# Patient Record
Sex: Male | Born: 1990 | Race: White | Hispanic: No | Smoking: Current every day smoker
Health system: Southern US, Community
[De-identification: ages and names within clinical notes are randomized; demographics above are authoritative.]

## PROBLEM LIST (undated history)

## (undated) DIAGNOSIS — L0591 Pilonidal cyst without abscess: Secondary | ICD-10-CM

## (undated) DIAGNOSIS — E119 Type 2 diabetes mellitus without complications: Secondary | ICD-10-CM

## (undated) DIAGNOSIS — S62609A Fracture of unspecified phalanx of unspecified finger, initial encounter for closed fracture: Secondary | ICD-10-CM

## (undated) DIAGNOSIS — Z9889 Other specified postprocedural states: Secondary | ICD-10-CM

## (undated) DIAGNOSIS — R112 Nausea with vomiting, unspecified: Secondary | ICD-10-CM

## (undated) HISTORY — DX: Type 2 diabetes mellitus without complications: E11.9

---
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
## 2011-01-22 ENCOUNTER — Ambulatory Visit (INDEPENDENT_AMBULATORY_CARE_PROVIDER_SITE_OTHER): Payer: BC Managed Care – PPO | Admitting: "Endocrinology

## 2011-01-22 DIAGNOSIS — IMO0001 Reserved for inherently not codable concepts without codable children: Secondary | ICD-10-CM

## 2011-01-22 DIAGNOSIS — N62 Hypertrophy of breast: Secondary | ICD-10-CM

## 2011-01-22 DIAGNOSIS — I10 Essential (primary) hypertension: Secondary | ICD-10-CM

## 2011-01-22 DIAGNOSIS — E782 Mixed hyperlipidemia: Secondary | ICD-10-CM

## 2011-01-22 DIAGNOSIS — E669 Obesity, unspecified: Secondary | ICD-10-CM

## 2011-02-21 LAB — DIFFERENTIAL
Basophils Absolute: 0 10*3/uL (ref 0.0–0.1)
Basophils Relative: 0 % (ref 0–1)
Lymphocytes Relative: 31 % (ref 24–48)
Monocytes Relative: 8 % (ref 3–11)
Neutrophils Relative %: 59 % (ref 43–71)

## 2011-02-21 LAB — CBC
HCT: 43.2 % (ref 36.0–49.0)
Hemoglobin: 14.9 g/dL (ref 12.0–16.0)
MCHC: 34.6 g/dL (ref 31.0–37.0)
Platelets: 327 10*3/uL (ref 150–400)
RDW: 13.2 % (ref 11.4–15.5)
WBC: 8.4 10*3/uL (ref 4.5–13.5)

## 2011-02-21 LAB — BASIC METABOLIC PANEL
BUN: 10 mg/dL (ref 6–23)
Calcium: 9.8 mg/dL (ref 8.4–10.5)
Glucose, Bld: 154 mg/dL — ABNORMAL HIGH (ref 70–99)
Potassium: 3.8 mEq/L (ref 3.5–5.1)
Sodium: 138 mEq/L (ref 135–145)

## 2011-02-21 LAB — GLUCOSE, CAPILLARY
Glucose-Capillary: 177 mg/dL — ABNORMAL HIGH (ref 70–99)
Glucose-Capillary: 179 mg/dL — ABNORMAL HIGH (ref 70–99)
Glucose-Capillary: 188 mg/dL — ABNORMAL HIGH (ref 70–99)
Glucose-Capillary: 194 mg/dL — ABNORMAL HIGH (ref 70–99)
Glucose-Capillary: 232 mg/dL — ABNORMAL HIGH (ref 70–99)

## 2011-02-21 LAB — URINALYSIS, ROUTINE W REFLEX MICROSCOPIC
Glucose, UA: 100 mg/dL — AB
Hgb urine dipstick: NEGATIVE
Ketones, ur: 15 mg/dL — AB
Specific Gravity, Urine: 1.03 (ref 1.005–1.030)

## 2011-02-21 LAB — HEMOGLOBIN A1C
Hgb A1c MFr Bld: 11.6 % — ABNORMAL HIGH (ref 4.6–6.1)
Mean Plasma Glucose: 286 mg/dL

## 2011-02-21 LAB — POCT I-STAT EG7
Bicarbonate: 29.2 mEq/L — ABNORMAL HIGH (ref 20.0–24.0)
O2 Saturation: 98 %
Potassium: 4.5 mEq/L (ref 3.5–5.1)
TCO2: 31 mmol/L (ref 0–100)
pCO2, Ven: 46.8 mmHg (ref 45.0–50.0)

## 2011-02-21 LAB — GLUTAMIC ACID DECARBOXYLASE AUTO ABS: Glutamic Acid Decarb Ab: <1 U/mL (ref <=1.0)

## 2011-02-21 LAB — INSULIN ANTIBODIES, BLOOD: Insulin Antibodies, Human: 0.1 U/mL (ref <0.4)

## 2011-02-21 LAB — C-PEPTIDE: C-Peptide: 3.37 ng/mL (ref 0.80–3.90)

## 2011-02-21 LAB — INSULIN, RANDOM: Insulin: 33 u[IU]/mL — ABNORMAL HIGH (ref 3–28)

## 2011-02-21 LAB — MAGNESIUM: Magnesium: 1.9 mg/dL (ref 1.5–2.5)

## 2011-02-21 LAB — ANTI-ISLET CELL ANTIBODY: Pancreatic Islet Cell Antibody: <5 JDF Units (ref <5)

## 2011-03-14 ENCOUNTER — Other Ambulatory Visit: Payer: Self-pay | Admitting: *Deleted

## 2011-03-14 ENCOUNTER — Encounter: Payer: Self-pay | Admitting: *Deleted

## 2011-03-14 DIAGNOSIS — E049 Nontoxic goiter, unspecified: Secondary | ICD-10-CM

## 2011-03-14 DIAGNOSIS — IMO0001 Reserved for inherently not codable concepts without codable children: Secondary | ICD-10-CM

## 2011-03-14 DIAGNOSIS — E669 Obesity, unspecified: Secondary | ICD-10-CM

## 2011-03-14 DIAGNOSIS — E782 Mixed hyperlipidemia: Secondary | ICD-10-CM

## 2011-04-25 ENCOUNTER — Ambulatory Visit (INDEPENDENT_AMBULATORY_CARE_PROVIDER_SITE_OTHER): Payer: BC Managed Care – PPO | Admitting: "Endocrinology

## 2011-04-25 VITALS — BP 132/83 | HR 82 | Wt 241.0 lb

## 2011-04-25 DIAGNOSIS — F17200 Nicotine dependence, unspecified, uncomplicated: Secondary | ICD-10-CM

## 2011-04-25 DIAGNOSIS — N62 Hypertrophy of breast: Secondary | ICD-10-CM

## 2011-04-25 DIAGNOSIS — I10 Essential (primary) hypertension: Secondary | ICD-10-CM

## 2011-04-25 DIAGNOSIS — IMO0001 Reserved for inherently not codable concepts without codable children: Secondary | ICD-10-CM

## 2011-04-25 DIAGNOSIS — E049 Nontoxic goiter, unspecified: Secondary | ICD-10-CM

## 2011-04-25 MED ORDER — LISINOPRIL 10 MG PO TABS: 10.0000 mg | ORAL_TABLET | Freq: Every day | ORAL | Status: DC

## 2011-08-09 ENCOUNTER — Ambulatory Visit: Payer: BC Managed Care – PPO | Admitting: "Endocrinology

## 2011-08-13 ENCOUNTER — Ambulatory Visit: Payer: BC Managed Care – PPO | Admitting: "Endocrinology

## 2011-08-14 ENCOUNTER — Ambulatory Visit (INDEPENDENT_AMBULATORY_CARE_PROVIDER_SITE_OTHER): Payer: BC Managed Care – PPO | Admitting: "Endocrinology

## 2011-08-14 ENCOUNTER — Encounter: Payer: Self-pay | Admitting: "Endocrinology

## 2011-08-14 VITALS — BP 121/72 | HR 91 | Wt 234.5 lb

## 2011-08-14 DIAGNOSIS — R945 Abnormal results of liver function studies: Secondary | ICD-10-CM

## 2011-08-14 DIAGNOSIS — N62 Hypertrophy of breast: Secondary | ICD-10-CM | POA: Insufficient documentation

## 2011-08-14 DIAGNOSIS — IMO0001 Reserved for inherently not codable concepts without codable children: Secondary | ICD-10-CM

## 2011-08-14 DIAGNOSIS — E669 Obesity, unspecified: Secondary | ICD-10-CM

## 2011-08-14 DIAGNOSIS — I10 Essential (primary) hypertension: Secondary | ICD-10-CM | POA: Insufficient documentation

## 2011-08-14 DIAGNOSIS — E119 Type 2 diabetes mellitus without complications: Secondary | ICD-10-CM

## 2011-08-14 DIAGNOSIS — L83 Acanthosis nigricans: Secondary | ICD-10-CM

## 2011-08-14 DIAGNOSIS — R7989 Other specified abnormal findings of blood chemistry: Secondary | ICD-10-CM

## 2011-08-14 DIAGNOSIS — E782 Mixed hyperlipidemia: Secondary | ICD-10-CM | POA: Insufficient documentation

## 2011-08-14 DIAGNOSIS — E049 Nontoxic goiter, unspecified: Secondary | ICD-10-CM

## 2011-08-14 LAB — POCT GLYCOSYLATED HEMOGLOBIN (HGB A1C): Hemoglobin A1C: 5.6

## 2011-08-14 NOTE — Progress Notes (Signed)
Subjective:  Patient Name: Grant Mcdonald Date of Birth: 04-Jan-1991  MRN: [id number]  Grant Mcdonald  presents to the office today for follow-up of his type 2 diabetes mellitus, goiter, acanthosis, obesity, gynecomastia, and hypertension.  HISTORY OF PRESENT ILLNESS:   Grant Mcdonald is a 20 y.o. Caucasian male.  1. The patient was admitted to [facility] pediatric ward on 10/14/2009 by his primary care pediatrician Dr. Eliberto Ivory of [facility], for new onset diabetes mellitus. The patient was then 49-1/19 years old. For several weeks prior to admission he had progressive polyuria and polydipsia. His grandmother, who has type 2 diabetes herself, tested his blood sugar. He had several blood sugars in the 200s and even one more than 300. She started him on her own medicine, glimepiride, approximately half dose for several days. She then made arrangements for the patient be seen by Dr. Chestine Spore. In Dr. Ophelia Charter office his blood sugars were in the 170s. Dr. Chestine Spore then made arrangements for the child to be admitted. On admission he was noted to be mildly dehydrated. He was also noted to have acanthosis of the posterior neck. He was obese, with a weight of 112.5 kg (250 pounds) and height of 187.36 cm (74 inches). His initial venous pH was 7.4. Hemoglobin A1c was 11.6%. Urine glucose was 1000 and he had small ketones. A serum beta hydroxybutyric was 7.7 with normal being less than 3.0. His insulin C-peptide was 3.37, with normals being 0.9-3.9. His thyroid function tests were normal. I saw the patient in consultation on the pediatric ward. Family history was positive for the the patient's maternal grandmother and father having had diabetes. Father died of an MI at age 66. In addition to having obesity and diabetes, the father was also a smoker. The mother had bipolar disease. She died shortly before the admission. The child was living with paternal grandmother, Ms. Weniger, who had been taking  care of him since 56 days of age. On examination hia blood pressure was 129/71. He had a 25+ gram goiter. He had 2+ acanthosis nigricans of his neck. His breasts were enlarged with a Tanner 2 configuration. The right areola was 45 mm in diameter. The left was 47 mm in diameter. He was started on metformin 500 mg twice daily.  2. During the last 2 years the patient's weight, gynecomastia, and diabetes have improved gradually, but not always progressively. In November of 2011 his weight was down to 234. On his last clinic visit on 01/22/2011 his weight was down to 232 pounds. Blood pressure at that point was 136/79. I encouraged him to exercise at least 1 hour per day. 3. Pertinent Review of Systems:  Constitutional: The patient feels well, is healthy, and has no significant complaints. Eyes: Vision is good. There are no significant eye complaints. Neck: The patient has no complaints of anterior neck swelling, soreness, tenderness,  pressure, discomfort, or difficulty swallowing.  Heart: Heart rate increases with exercise or other physical activity. The patient has no complaints of palpitations, irregular heat beats, chest pain, or chest pressure. Gastrointestinal: Bowel movents seem normal. The patient has no complaints of excessive hunger, acid reflux, upset stomach, stomach aches or pains, diarrhea, or constipation. Legs: Muscle mass and strength seem normal. There are no complaints of numbness, tingling, burning, or pain. No edema is noted. Feet: There are no obvious foot problems. There are no complaints of numbness, tingling, burning, or pain. No edema is noted. Breast tissue: The patient thinks the breast tissue is  about the same.   PAST MEDICAL, FAMILY, AND SOCIAL HISTORY:  Past Medical History  Diagnosis Date  . Type 2 diabetes mellitus not at goal   . Obesity   . Acanthosis nigricans, acquired   . Goiter   . Gynecomastia, male   . Hypertension   . Combined hyperlipidemia   . Abnormal  liver function tests     Family History  Problem Relation Age of Onset  . Diabetes Father   . Obesity Father   . Diabetes Maternal Grandmother   . Thyroid disease Neg Hx     Current outpatient prescriptions:metFORMIN (GLUCOPHAGE) 500 MG tablet, Take 500 mg by mouth 2 (two) times daily with a meal.  , Disp: , Rfl: ;  lisinopril (PRINIVIL,ZESTRIL) 10 MG tablet, Take 1 tablet (10 mg total) by mouth daily., Disp: 30 tablet, Rfl: 11  Allergies as of 04/25/2011  . (No Known Allergies)    1. Work and Family: . He is out of school now. 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 2. Activities: The patient has a membership to [employer] and goes frequently 3. Smoking, alcohol, or drugs: Patient is still smoking 4. Primary Care Provider: Dr. Eliberto Ivory of [facility]  ROS: There are no other significant problems involving his other six body systems.   Objective:  Vital Signs:  BP 132/83  Pulse 82  Wt 241 lb (109.317 kg) Hemoglobin A1c 6.0%   Ht Readings from Last 3 Encounters:  No data found for Ht   Wt Readings from Last 3 Encounters:  08/14/11 234 lb 8 oz (106.369 kg) (98.45%*)  04/25/11 241 lb (109.317 kg) (98.87%*)   * Growth percentiles are based on CDC 2-20 Years data.   PHYSICAL EXAM: Constitutional: The patient appears healthy but obese.   Eyes: The eyes appear to be normally formed and spaced. Gaze is conjugate. There is no obvious arcus or proptosis. Moisture appears normal. Ears: The ears are normally placed and appear externally normal. Mouth: The oropharynx and tongue appear normal. Dentition appears to be normal for age. Oral moisture is normal. Neck: The neck appears to be visibly normal. No carotid bruits are noted. The thyroid gland is 25 grams in size. The consistency of the thyroid gland is relatively firm.  The thyroid gland is not tender to palpation. Lungs: The lungs are clear to auscultation. Air movement is good. Heart: Heart  rate and rhythm are regular.Heart sounds S1 and S2 are normal. I did not appreciate any pathologic cardiac murmurs. Abdomen: The abdomen is enlarged. Bowel sounds are normal. There is no obvious hepatomegaly, splenomegaly, or other mass effect.  Arms: Muscle size and bulk are normal for age. Hands: There is no obvious tremor. Phalangeal and metacarpophalangeal joints are normal. Palmar muscles are normal for age. Palmar skin is normal. Palmar moisture is also normal. Legs: Muscles appear normal for age. No edema is present. Feet: Feet are normally formed. Dorsalis pedal pulses are normal. Neurologic: Strength is normal for age in both the upper and lower extremities. Muscle tone is normal. Sensation to touch is normal in both the legs and feet.   Breast tissue: The breasts have a Tanner 3 configuration. Both areolae measure 50 mm in diameter.  LAB DATA: None recently   Assessment and Plan:   ASSESSMENT:  1. Type 2 diabetes mellitus: Blood sugars continued to improve. This is his best hemoglobin A1c since the diagnosis of type 2 diabetes mellitus in 2010. Exercise is definitely helping.  2. Obesity: Patient's weight is up 9 pounds. Part of the increase in weight may be muscle associated with exercise, but some does appear to be due to increased fat. 3. Hypertension: Patient's blood pressure is about the same. I encouraged him to eat right and to continue to exercise. 4. Gynecomastia: Problem is slightly worse, in association with the weight gain 5. Goiter: Thyroid is slightly larger in size at this visit. 6. Smoking: I've encouraged patient once again to stop smoking.  PLAN:  1. Diagnostic: No new labs are ordered. 2. Therapeutic: Start lisinopril, 2.5 mg per day. Resume the eat right diet plan. Continue efforts at exercise. Stop smoking 3. Patient education: We discussed the relationships between waking, hypertension, type 2 diabetes, and atherosclerotic cardiovascular disease. 4.  Follow-up: Followup in 4 months.  Level of Service: This visit lasted in excess of 40 minutes. More than 50% of the visit was devoted to counseling.

## 2011-08-14 NOTE — Patient Instructions (Signed)
Followup visit in 4 months. Please try to fit in at least one hour of exercise daily. Please follow the eat right diet or [employer] as much as possible. Please have lab tests done about one week prior to next visit.

## 2011-08-14 NOTE — Progress Notes (Signed)
Subjective:  Patient Name: Grant Mcdonald Date of Birth: [birth year]  MRN: [id number]  Grant Mcdonald  presents to Grant office today for follow-up of his type 2 diabetes mellitus, obesity, acanthosis, goiter, gynecomastia, hypertension, hyperlipidemia, and abnormal liver function tests.  HISTORY OF PRESENT ILLNESS:   Grant Mcdonald is a 20 y.o. Caucasian male.  1. Grant patient was admitted to [facility]'s pediatric ward on 10/14/09 by his primary care pediatrician, Dr. Eliberto Ivory of [facility], for new onset diabetes mellitus. He was then 67-1/19 years old. For several weeks prior to admission Grant patient had progressive polyuria and polydipsia. His grandmother, who had type II diabetes herself, tested his blood sugar. He had several blood sugars in Grant 200s and even one above 300. She started him on her own medicine, glimepiride, at approximately half dose for several days. She then made arrangements for Grant Mcdonald to be seen by Dr. Chestine Spore. In Dr. Ophelia Charter office Grant blood sugars were in Grant 170s. Dr. Chestine Spore then made arrangements for him to be admitted. On admission he was noted to be mildly dehydrated. He was also noted to have acanthosis of Grant neck. He was obese, with a weight of 112.5 kg (250 pounds) and height 187.36 cm (74 inches). His initial venous pH was 7.4. Hemoglobin A1c was 11.6%. Urine glucose was 1000 and he had small ketones. A serum beta hydroxybutyrate was 7.7, with normal being less than 3.0. His C-peptide was 3.37, with normals being 0.9-3.9. His thyroid function tests were normal. I saw Grant patient in consultation on Grant pediatric ward. Grant patient's maternal grandmother and father had diabetes. Father died of an MI at age 22. In addition to having obesity and diabetes, Grant father was also a smoker. Grant mother had bipolar disease. She died shortly before Grant admission. Grant Mcdonald was living with his paternal grandmother, Ms. Brager, who has actually been taking care of him  since 20 days of age. On examination his blood pressure was 129/71. He had a 25+ gram  goiter. He had 2+ acanthosis nigricans of his neck. His breasts were enlarged with a Tanner 2 breast configuration. Grant right areola was 45 mm in diameter. Grant left was 47 mm in diameter. He was started on metformin, 500 mg twice daily. 2. During Grant last 2 years, Grant patient's weight, gynecomastia, and diabetes have improved gradually but not always progressively. In November of 2011 his weight was down to 234.Grant patient's last [facility] visit was on 04/25/2011. His weight was up to 241.When I saw that his blood pressure increased in parallel, I started him on lisinopril, 2.5 mg per day.  In Grant interim, he is eating out much less often. He's also been walking a little more. 3. Pertinent Review of Systems:  Constitutional: Grant patient feels well, is healthy, and has no significant complaints. He has lost some weight and his clothes are fitting somewhat better Eyes: Vision is good. There are no significant eye complaints. Neck: Grant patient has no complaints of anterior neck swelling, soreness, tenderness,  pressure, discomfort, or difficulty swallowing.  Heart: Heart rate increases with exercise or other physical activity. Grant patient has no complaints of palpitations, irregular heat beats, chest pain, or chest pressure. Gastrointestinal: Bowel movents seem normal. Grant patient has no complaints of excessive hunger, acid reflux, upset stomach, stomach aches or pains, diarrhea, or constipation. Legs: Muscle mass and strength seem normal. There are no complaints of numbness, tingling, burning, or pain. No edema is noted. Feet: There are  no obvious foot problems. There are no complaints of numbness, tingling, burning, or pain. No edema is noted. Breast tissue: Patient states that there is less fatty tissue beneath Grant nipples now.   PAST MEDICAL, FAMILY, AND SOCIAL HISTORY  Current outpatient prescriptions:lisinopril  (PRINIVIL,ZESTRIL) 10 MG tablet, Take 1 tablet (10 mg total) by mouth daily., Disp: 30 tablet, Rfl: 11;  metFORMIN (GLUCOPHAGE) 500 MG tablet, Take 500 mg by mouth 2 (two) times daily with a meal.  , Disp: , Rfl:   Allergies as of 08/14/2011  . (No Known Allergies)    1. Work and Family: Grant patient is still living with his grandmother. 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 He is trying to earn some money to go full-time to [employer] 2. Activities: He walks about 20 minutes per day. 3. Smoking, alcohol, or drugs: Reportedly none 4. Primary Care Provider: Dr. Eliberto Ivory, [facility]  ROS: There are no other significant problems involving his other six body systems.   Objective:  Vital Signs:  BP 121/72  Pulse 91  Wt 234 lb 8 oz (106.369 kg)   Ht Readings from Last 3 Encounters:  No data found for Ht   Wt Readings from Last 3 Encounters:  08/14/11 234 lb 8 oz (106.369 kg) (98.45%*)  04/25/11 241 lb (109.317 kg) (98.87%*)   * Growth percentiles are based on CDC 2-20 Years data.   PHYSICAL EXAM: Constitutional: Grant patient appears healthy and somewhat slimmer. His height is at Grant 96th percentile for age. His weight is greater than Grant 97th percentile for age.  Eyes: There is no obvious arcus or proptosis. Moisture appears normal. Ears: Grant ears are normally placed and appear externally normal. Mouth: Grant oropharynx and tongue appear normal. Oral moisture is normal. Neck: Grant neck appears to be visibly enlarged. No carotid bruits are noted. Grant thyroid gland is 25+ grams in size. Grant consistency of Grant thyroid gland is relatively firm. Grant thyroid gland is not tender to palpation. Lungs: Grant lungs are clear to auscultation. Air movement is good. Heart: Heart rate and rhythm are regular.Heart sounds S1 and S2 are normal. I did not appreciate any pathologic cardiac murmurs. Abdomen: Grant abdomen is large. Bowel sounds are normal.  There is no obvious hepatomegaly, splenomegaly, or other mass effect.  Arms: Muscle size and bulk are normal for age. Hands: There is no obvious tremor. Phalangeal and metacarpophalangeal joints are normal. Palmar muscles are normal for age. Palmar skin is normal. Palmar moisture is also normal. Legs: Muscles appear normal for age. No edema is present. Neurologic: Strength is normal for age in both Grant upper and lower extremities. Muscle tone is normal. Sensation to touch is normal in both Grant legs and feet.   Chest: Breasts are definitely smaller. There is less fatty tissue. Grant breasts have a Tanner 1.2 configuration. Grant right areola is 45 mm. Grant left areola is 48 mm.  LAB DATA: 01/10/11: TSH was 1.433, free T4 1.03, and free T3 3.2. Insulin C.-peptide was 3.10 (normal 0.80-3.9).   Assessment and Plan:   ASSESSMENT:  1. Type 2 diabetes mellitus: Grant patient's hemoglobin A1c is now within normal limits. If he takes his medication, eats right, and exercises he can have normal sugars all Grant time. As he has lost weight, his sugars have improved. 2. Obesity: His weight is back down to 234 pounds. 3. Hypertension: Grant combination of low dose lisinopril, exercise, and weight loss has worked  very well. 4. Gynecomastia: Grant gynecomastia is improving in parallel with his weight loss. 5. Goiter: Grant patient was euthyroid in February. 6. Acanthosis: Grant acanthosis has also improved in parallel with weight loss. 7. Abnormal liver function tests: These abnormalities were likely due to nonalcoholic fatty liver disease. These abnormalities should also improve in parallel with weight loss.  PLAN: 1. Diagnostic: Thyroid function tests and CMP one week prior to next appointment. 2. Therapeutic: Continue current medications and eating right. Increase exercise to 1 hour per day. 3. Patient education: We discussed again Grant relationships between obesity, hypertension, diabetes, and atherosclerotic  cardiovascular disease. Grant patient is now mature enough to begin taking better care of himself. 4. Follow-up: Return in about 4 months (around 12/14/2011).  Level of Service: This visit lasted in excess of 40 minutes. More than 50% of Grant visit was devoted to counseling.

## 2011-11-15 ENCOUNTER — Other Ambulatory Visit: Payer: Self-pay | Admitting: "Endocrinology

## 2011-12-17 ENCOUNTER — Ambulatory Visit: Payer: BC Managed Care – PPO | Admitting: "Endocrinology

## 2011-12-22 LAB — COMPREHENSIVE METABOLIC PANEL
ALT: 70 U/L — ABNORMAL HIGH (ref 0–53)
Albumin: 5 g/dL (ref 3.5–5.2)
CO2: 27 mEq/L (ref 19–32)
Calcium: 10.3 mg/dL (ref 8.4–10.5)
Chloride: 103 mEq/L (ref 96–112)
Glucose, Bld: 98 mg/dL (ref 70–99)
Potassium: 4.3 mEq/L (ref 3.5–5.3)
Sodium: 139 mEq/L (ref 135–145)
Total Protein: 7.4 g/dL (ref 6.0–8.3)

## 2011-12-22 LAB — T3, FREE: T3, Free: 3.4 pg/mL (ref 2.3–4.2)

## 2012-01-14 ENCOUNTER — Other Ambulatory Visit: Payer: Self-pay | Admitting: *Deleted

## 2012-01-14 DIAGNOSIS — E669 Obesity, unspecified: Secondary | ICD-10-CM

## 2012-01-14 MED ORDER — METFORMIN HCL 500 MG PO TABS: 500.0000 mg | ORAL_TABLET | Freq: Two times a day (BID) | ORAL | Status: DC

## 2012-03-06 ENCOUNTER — Ambulatory Visit: Payer: BC Managed Care – PPO | Admitting: "Endocrinology

## 2012-06-23 ENCOUNTER — Encounter: Payer: Self-pay | Admitting: "Endocrinology

## 2012-06-23 ENCOUNTER — Ambulatory Visit (INDEPENDENT_AMBULATORY_CARE_PROVIDER_SITE_OTHER): Payer: BC Managed Care – PPO | Admitting: "Endocrinology

## 2012-06-23 VITALS — BP 118/76 | HR 103 | Wt 221.1 lb

## 2012-06-23 DIAGNOSIS — E049 Nontoxic goiter, unspecified: Secondary | ICD-10-CM

## 2012-06-23 DIAGNOSIS — Z6825 Body mass index (BMI) 25.0-25.9, adult: Secondary | ICD-10-CM

## 2012-06-23 DIAGNOSIS — N62 Hypertrophy of breast: Secondary | ICD-10-CM

## 2012-06-23 DIAGNOSIS — IMO0001 Reserved for inherently not codable concepts without codable children: Secondary | ICD-10-CM

## 2012-06-23 DIAGNOSIS — E663 Overweight: Secondary | ICD-10-CM

## 2012-06-23 DIAGNOSIS — R7989 Other specified abnormal findings of blood chemistry: Secondary | ICD-10-CM

## 2012-06-23 DIAGNOSIS — E669 Obesity, unspecified: Secondary | ICD-10-CM

## 2012-06-23 DIAGNOSIS — R945 Abnormal results of liver function studies: Secondary | ICD-10-CM

## 2012-06-23 DIAGNOSIS — I10 Essential (primary) hypertension: Secondary | ICD-10-CM

## 2012-06-23 DIAGNOSIS — L83 Acanthosis nigricans: Secondary | ICD-10-CM

## 2012-06-23 LAB — POCT GLYCOSYLATED HEMOGLOBIN (HGB A1C): Hemoglobin A1C: 5.1

## 2012-06-23 MED ORDER — GLUCOSE BLOOD VI STRP: ORAL_STRIP | Status: AC

## 2012-06-23 MED ORDER — METFORMIN HCL 500 MG PO TABS: 500.0000 mg | ORAL_TABLET | Freq: Two times a day (BID) | ORAL | Status: DC

## 2012-06-23 NOTE — Patient Instructions (Addendum)
Follow-up in 6 months. Please get labs done after an overnight (10+ hour ) fast.

## 2012-06-23 NOTE — Progress Notes (Signed)
Subjective:  Patient Name: Grant Mcdonald Date of Birth: 03-Feb-1991  MRN: [id number]  Seabron Iannello  presents to the office today for follow-up of his type 2 diabetes mellitus, obesity, acanthosis, goiter, gynecomastia, hypertension, hyperlipidemia, and abnormal liver function tests.  HISTORY OF PRESENT ILLNESS:   Grant Mcdonald is a 21 y.o. Caucasian male.  1. The patient was admitted to [facility]'s pediatric ward on 10/14/09 by his primary care pediatrician, Dr. Eliberto Ivory of [facility], for new onset diabetes mellitus. He was then 74-1/20 years old.  A. For several weeks prior to admission the patient had progressive polyuria and polydipsia. His grandmother, who had type II diabetes herself, tested his blood sugar. He had several blood sugars in the 200s and even one above 300. She started him on her own medicine, glimepiride, at approximately half dose for several days. She then made arrangements for the child to be seen by Dr. Chestine Spore. In Dr. Ophelia Charter office the blood sugars were in the 170s. Dr. Chestine Spore then made arrangements for him to be admitted.   B. On admission he was noted to be mildly dehydrated. He was also noted to have acanthosis of the neck. He was obese, with a weight of 112.5 kg (250 pounds) and height 187.36 cm (74 inches). His initial venous pH was 7.4. Hemoglobin A1c was 11.6%. Urine glucose was 1000 and he had small ketones. A serum beta hydroxybutyrate was 7.7, with normal being less than 3.0. His C-peptide was 3.37, with normals being 0.9-3.9. His thyroid function tests were normal.   C. I saw the patient in consultation on the pediatric ward. The patient's maternal grandmother and father had diabetes. Father died of an MI at age 81. In addition to having obesity and diabetes, the father was also a smoker. The mother had bipolar disease. She died shortly before the admission. The child was living with his paternal grandmother, Grant Mcdonald, who has actually been  taking care of him since 97 days of age. On examination his blood pressure was 129/71. He had a 25+ gram  goiter. He had 2+ acanthosis nigricans of his neck. His breasts were enlarged with a Tanner 2 breast configuration. The right areola was 45 mm in diameter. The left was 47 mm in diameter. He was started on metformin, 500 mg twice daily. 2. During the last 3 years, the patient's weight, gynecomastia, and diabetes have improved gradually but not always progressively. In November of 2011 and September of 2012 his weight was down to 234. 3. The patient's last [facility] visit was on 08/14/11. In the interim he has been healthier and has been losing weight intentionally. 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 He has had lots of physical activity on the job. He had to quit smoking because of the dyspnea on exertion he was experiencing. He has not been eating out as much. He ran out of refills for metformin about two months ago, but is still taking the 10 mg lisinopril daily.   4. Pertinent Review of Systems:  Constitutional: The patient feels a lot better, 10 X better. He has much more energy and more endurance and strength, much less fatigue. well, is healthy, and has no significant complaints. He has lost more weight and his clothes are fitting better. His shirt size has decreased from XL and XXL to Large. Eyes: Vision is good. There are no significant eye complaints. Neck: The patient has no complaints  of anterior neck swelling, soreness, tenderness,  pressure, discomfort, or difficulty swallowing.  Heart: Heart rate increases with exercise or other physical activity. The patient has no complaints of palpitations, irregular heat beats, chest pain, or chest pressure. Gastrointestinal: Bowel movents seem normal. The patient has no complaints of excessive hunger, acid reflux, upset stomach, stomach aches or pains, diarrhea, or constipation. Legs: Muscle  mass and strength seem normal. There are no complaints of numbness, tingling, burning, or pain. No edema is noted. Feet: There are no obvious foot problems. There are no complaints of numbness, tingling, burning, or pain. No edema is noted. Breast tissue: Patient states that there is very little fatty tissue beneath the nipples now. GU: No problems with libido or function.    PAST MEDICAL, FAMILY, AND SOCIAL HISTORY  Current outpatient prescriptions:metFORMIN (GLUCOPHAGE) 500 MG tablet, Take 1 tablet (500 mg total) by mouth 2 (two) times daily with a meal., Disp: 60 tablet, Rfl: 4;  lisinopril (PRINIVIL,ZESTRIL) 10 MG tablet, Take 1 tablet (10 mg total) by mouth daily., Disp: 30 tablet, Rfl: 11  Allergies as of 06/23/2012  . (No Known Allergies)    1. Work and Family: The patient is still living with his grandmother. He is working for a Firefighter as noted above. He is trying to earn some money to go full-time to [employer] He has [employer]. 2. Activities: He tries to get out and walk on weekends.  3. Smoking, alcohol, or drugs: He switched from cigarettes to smokeless tobacco. 4. Primary Care Provider: Dr. Chilton Si ?  REVIEW OF SYSTEMS: There are no other significant problems involving his other body systems.   Objective:  Vital Signs:  BP 118/76  Pulse 103  Wt 221 lb 1.6 oz (100.29 kg)   Height measured at plateau om 2011 was 187 cm = 73 inches. BMI is 29.8.  PHYSICAL EXAM: Constitutional: The patient appears healthy and slimmer. He seems much happier and more assured of himself.  Eyes: There is no obvious arcus or proptosis. Moisture appears normal. Mouth: The oropharynx and tongue appear normal. Oral moisture is normal. Neck: The neck is visibly enlarged. No carotid bruits are noted. The thyroid gland is 25+ grams in size. The consistency of the thyroid gland is relatively firm. The thyroid gland is not tender to palpation. Lungs: The lungs are clear to auscultation. Air  movement is good. Heart: Heart rate and rhythm are regular. Heart sounds S1 and S2 are normal. I did not appreciate any pathologic cardiac murmurs. Abdomen: The abdomen is large, but smaller. He still has a lot of abdominal fat. Bowel sounds are normal. There is no obvious hepatomegaly, splenomegaly, or other mass effect.  Arms: Muscle size and bulk are normal for age. Hands: There is no obvious tremor. Phalangeal and metacarpophalangeal joints are normal. Palmar muscles are normal for age. Palmar skin is normal. Palmar moisture is also normal. Legs: Muscles appear normal for age. No edema is present. Neurologic: Strength is normal for age in both the upper and lower extremities. Muscle tone is normal. Sensation to touch is normal in both the legs and feet.   Chest: Breasts are definitely smaller. There is only a little fatty tissue. The breasts have a Tanner stage 1.0 configuration. The right areola is 40 mm. The left areola is 40 mm.  LAB DATA: HbA1c today is 5.1%, compared with 5.6% last September 2012, and 6.0% in June 2012.   01/10/11: TSH was 1.433, free T4 1.03, and free T3 3.2.  Insulin C.-peptide was 3.10 (normal 0.80-3.9).   Assessment and Plan:   ASSESSMENT:  1. Type 2 diabetes mellitus: The patient's hemoglobin A1c is now well within normal limits, even after his metformin wore off two months ago. In essence, his T2DM is controlled by his diet and his exercise.  As he has lost weight, his sugars have improved. 2. Obesity: His weight is back down to 221 pounds. His BMI is 29.8, so he is now overweight rather than obese. I've recommended resuming metformin twice daily an adjunct to further weight loss and BG control. 3. Hypertension: The combination of low dose lisinopril, exercise, and weight loss has worked very well. 4. Gynecomastia: The gynecomastia has almost resolved.  5. Goiter: The thyroid gland is unchanged in size. The patient was euthyroid in February 2012. 6. Acanthosis:  The acanthosis has essentially resolved.  7. Abnormal liver function tests: These abnormalities were likely due to nonalcoholic fatty liver disease. These abnormalities should also improve in parallel with weight loss.  PLAN: 1. Diagnostic: Thyroid function tests, CMP, and lipid panel. 2. Therapeutic: Continue current medications (metformin and lisinopril). Consider reducing the lisinopril 50% on days when he will be working out in the heat. Eat right and exercise for about an hour a day as possible. Stop tobacco.  3. Patient education: We discussed again the relationships between obesity, hypertension, diabetes, and atherosclerotic cardiovascular disease. The patient still wants to go into the [employer], so he is more motivated to work at weight loss.  4. Follow-up: 6 moths  Level of Service: This visit lasted in excess of 50 minutes. More than 50% of the visit was devoted to counseling.  David Stall

## 2012-08-27 ENCOUNTER — Ambulatory Visit (HOSPITAL_BASED_OUTPATIENT_CLINIC_OR_DEPARTMENT_OTHER): Payer: BC Managed Care – PPO | Admitting: Anesthesiology

## 2012-08-27 ENCOUNTER — Encounter (HOSPITAL_BASED_OUTPATIENT_CLINIC_OR_DEPARTMENT_OTHER)
Admission: RE | Disposition: A | Discharge status: Home / Self Care | Payer: Self-pay | Source: Ambulatory Visit | Attending: Orthopedic Surgery

## 2012-08-27 ENCOUNTER — Encounter (HOSPITAL_BASED_OUTPATIENT_CLINIC_OR_DEPARTMENT_OTHER): Payer: Self-pay | Admitting: Anesthesiology

## 2012-08-27 ENCOUNTER — Ambulatory Visit (HOSPITAL_BASED_OUTPATIENT_CLINIC_OR_DEPARTMENT_OTHER)
Admission: RE | Admit: 2012-08-27 | Discharge: 2012-08-27 | Disposition: A | Discharge status: Home / Self Care | Payer: BC Managed Care – PPO | Source: Ambulatory Visit | Attending: Orthopedic Surgery | Admitting: Orthopedic Surgery

## 2012-08-27 ENCOUNTER — Encounter (HOSPITAL_BASED_OUTPATIENT_CLINIC_OR_DEPARTMENT_OTHER): Payer: Self-pay | Admitting: *Deleted

## 2012-08-27 DIAGNOSIS — L83 Acanthosis nigricans: Secondary | ICD-10-CM | POA: Insufficient documentation

## 2012-08-27 DIAGNOSIS — E669 Obesity, unspecified: Secondary | ICD-10-CM | POA: Insufficient documentation

## 2012-08-27 DIAGNOSIS — N62 Hypertrophy of breast: Secondary | ICD-10-CM | POA: Insufficient documentation

## 2012-08-27 DIAGNOSIS — E785 Hyperlipidemia, unspecified: Secondary | ICD-10-CM | POA: Insufficient documentation

## 2012-08-27 DIAGNOSIS — I10 Essential (primary) hypertension: Secondary | ICD-10-CM | POA: Insufficient documentation

## 2012-08-27 DIAGNOSIS — W19XXXA Unspecified fall, initial encounter: Secondary | ICD-10-CM | POA: Insufficient documentation

## 2012-08-27 DIAGNOSIS — E119 Type 2 diabetes mellitus without complications: Secondary | ICD-10-CM | POA: Insufficient documentation

## 2012-08-27 DIAGNOSIS — S62309A Unspecified fracture of unspecified metacarpal bone, initial encounter for closed fracture: Secondary | ICD-10-CM | POA: Insufficient documentation

## 2012-08-27 HISTORY — PX: ORIF METACARPAL FRACTURE: SUR940

## 2012-08-27 LAB — GLUCOSE, CAPILLARY: Glucose-Capillary: 129 mg/dL — ABNORMAL HIGH (ref 70–99)

## 2012-08-27 LAB — POCT I-STAT, CHEM 8
HCT: 46 % (ref 39.0–52.0)
Hemoglobin: 15.6 g/dL (ref 13.0–17.0)
Potassium: 3.9 mEq/L (ref 3.5–5.1)
Sodium: 143 mEq/L (ref 135–145)

## 2012-08-27 SURGERY — OPEN REDUCTION INTERNAL FIXATION (ORIF) METACARPAL
Anesthesia: General | Site: Hand | Laterality: Right | Wound class: Clean

## 2012-08-27 MED ORDER — MIDAZOLAM HCL 2 MG/2ML IJ SOLN
1.0000 mg | INTRAMUSCULAR | Status: DC | PRN
  Administered 2012-08-27: 4 mg via INTRAVENOUS

## 2012-08-27 MED ORDER — BUPIVACAINE-EPINEPHRINE PF 0.5-1:200000 % IJ SOLN
INTRAMUSCULAR | Status: DC | PRN
  Administered 2012-08-27: 25 mL

## 2012-08-27 MED ORDER — PROPOFOL 10 MG/ML IV BOLUS
INTRAVENOUS | Status: DC | PRN
Start: 2012-08-27 — End: 2012-08-27
  Administered 2012-08-27: 260 mg via INTRAVENOUS

## 2012-08-27 MED ORDER — FENTANYL CITRATE 0.05 MG/ML IJ SOLN
50.0000 ug | Freq: Once | INTRAMUSCULAR | Status: AC
  Administered 2012-08-27: 100 ug via INTRAVENOUS

## 2012-08-27 MED ORDER — DEXAMETHASONE SODIUM PHOSPHATE 4 MG/ML IJ SOLN
INTRAMUSCULAR | Status: DC | PRN
  Administered 2012-08-27: 4 mg

## 2012-08-27 MED ORDER — OXYCODONE-ACETAMINOPHEN 5-325 MG PO TABS: 1.0000 | ORAL_TABLET | ORAL | Status: DC | PRN

## 2012-08-27 MED ORDER — CEFAZOLIN SODIUM-DEXTROSE 2-3 GM-% IV SOLR
INTRAVENOUS | Status: DC | PRN
  Administered 2012-08-27: 2 g via INTRAVENOUS

## 2012-08-27 MED ORDER — FENTANYL CITRATE 0.05 MG/ML IJ SOLN
50.0000 ug | INTRAMUSCULAR | Status: DC | PRN
  Administered 2012-08-27: 100 ug via INTRAVENOUS

## 2012-08-27 MED ORDER — FENTANYL CITRATE 0.05 MG/ML IJ SOLN
INTRAMUSCULAR | Status: DC | PRN
  Administered 2012-08-27: 75 ug via INTRAVENOUS

## 2012-08-27 MED ORDER — HYDROMORPHONE HCL PF 1 MG/ML IJ SOLN: 0.2500 mg | INTRAMUSCULAR | Status: DC | PRN

## 2012-08-27 MED ORDER — OXYCODONE HCL 5 MG PO TABS: 5.0000 mg | ORAL_TABLET | Freq: Once | ORAL | Status: DC | PRN

## 2012-08-27 MED ORDER — ONDANSETRON HCL 4 MG/2ML IJ SOLN
INTRAMUSCULAR | Status: DC | PRN
  Administered 2012-08-27: 4 mg via INTRAVENOUS

## 2012-08-27 MED ORDER — CEFAZOLIN SODIUM-DEXTROSE 2-3 GM-% IV SOLR: 2.0000 g | Freq: Once | INTRAVENOUS | Status: DC

## 2012-08-27 MED ORDER — OXYCODONE HCL 5 MG/5ML PO SOLN: 5.0000 mg | Freq: Once | ORAL | Status: DC | PRN

## 2012-08-27 MED ORDER — LIDOCAINE HCL (CARDIAC) 20 MG/ML IV SOLN
INTRAVENOUS | Status: DC | PRN
  Administered 2012-08-27: 100 mg via INTRAVENOUS

## 2012-08-27 MED ORDER — LACTATED RINGERS IV SOLN
INTRAVENOUS | Status: DC
  Administered 2012-08-27 (×2): via INTRAVENOUS

## 2012-08-27 SURGICAL SUPPLY — 72 items
APL SKNCLS STERI-STRIP NONHPOA (GAUZE/BANDAGES/DRESSINGS) ×1
BAG DECANTER FOR FLEXI CONT (MISCELLANEOUS) IMPLANT
BANDAGE ELASTIC 3 VELCRO ST LF (GAUZE/BANDAGES/DRESSINGS) ×1 IMPLANT
BENZOIN TINCTURE PRP APPL 2/3 (GAUZE/BANDAGES/DRESSINGS) ×1 IMPLANT
BIT DRILL 100X2XQC STRL (BIT) IMPLANT
BIT DRILL QC 2.0X100 (BIT) ×2
BIT DRILL SOLID 1.5X85 (BIT) ×1 IMPLANT
BIT DRL 100X2XQC STRL (BIT) ×1
BLADE SURG 15 STRL LF DISP TIS (BLADE) ×2 IMPLANT
BLADE SURG 15 STRL SS (BLADE) ×2
BLADE SURG ROTATE 9660 (MISCELLANEOUS) IMPLANT
BNDG CMPR 9X4 STRL LF SNTH (GAUZE/BANDAGES/DRESSINGS) ×1
BNDG CMPR MD 5X2 ELC HKLP STRL (GAUZE/BANDAGES/DRESSINGS) ×1
BNDG ELASTIC 2 VLCR STRL LF (GAUZE/BANDAGES/DRESSINGS) ×1 IMPLANT
BNDG ESMARK 4X9 LF (GAUZE/BANDAGES/DRESSINGS) ×1 IMPLANT
CANISTER SUCTION 1200CC (MISCELLANEOUS) ×1 IMPLANT
CLOTH BEACON ORANGE TIMEOUT ST (SAFETY) ×2 IMPLANT
COVER MAYO STAND STRL (DRAPES) ×2 IMPLANT
COVER TABLE BACK 60X90 (DRAPES) ×2 IMPLANT
CUFF TOURNIQUET SINGLE 18IN (TOURNIQUET CUFF) ×1 IMPLANT
DECANTER SPIKE VIAL GLASS SM (MISCELLANEOUS) IMPLANT
DRAPE EXTREMITY T 121X128X90 (DRAPE) ×2 IMPLANT
DRAPE OEC MINIVIEW 54X84 (DRAPES) ×2 IMPLANT
DRAPE SURG 17X23 STRL (DRAPES) ×2 IMPLANT
DRSG EMULSION OIL 3X3 NADH (GAUZE/BANDAGES/DRESSINGS) ×1 IMPLANT
DURAPREP 26ML APPLICATOR (WOUND CARE) ×2 IMPLANT
ELECT REM PT RETURN 9FT ADLT (ELECTROSURGICAL) ×2
ELECTRODE REM PT RTRN 9FT ADLT (ELECTROSURGICAL) ×1 IMPLANT
GLOVE BIO SURGEON STRL SZ 6.5 (GLOVE) ×1 IMPLANT
GLOVE BIOGEL PI IND STRL 7.0 (GLOVE) IMPLANT
GLOVE BIOGEL PI IND STRL 8 (GLOVE) ×2 IMPLANT
GLOVE BIOGEL PI INDICATOR 7.0 (GLOVE) ×1
GLOVE BIOGEL PI INDICATOR 8 (GLOVE) ×2
GLOVE ECLIPSE 7.5 STRL STRAW (GLOVE) ×4 IMPLANT
GLOVE EXAM NITRILE PF MED BLUE (GLOVE) ×1 IMPLANT
GOWN BRE IMP PREV XXLGXLNG (GOWN DISPOSABLE) ×3 IMPLANT
GOWN PREVENTION PLUS XLARGE (GOWN DISPOSABLE) ×2 IMPLANT
GOWN PREVENTION PLUS XXLARGE (GOWN DISPOSABLE) ×1 IMPLANT
NEEDLE HYPO 22GX1.5 SAFETY (NEEDLE) IMPLANT
NS IRRIG 1000ML POUR BTL (IV SOLUTION) ×1 IMPLANT
PACK BASIN DAY SURGERY FS (CUSTOM PROCEDURE TRAY) ×2 IMPLANT
PAD CAST 3X4 CTTN HI CHSV (CAST SUPPLIES) ×1 IMPLANT
PADDING CAST ABS 4INX4YD NS (CAST SUPPLIES)
PADDING CAST ABS COTTON 4X4 ST (CAST SUPPLIES) ×1 IMPLANT
PADDING CAST COTTON 3X4 STRL (CAST SUPPLIES) ×2
PADDING UNDERCAST 2  STERILE (CAST SUPPLIES) IMPLANT
PENCIL BUTTON HOLSTER BLD 10FT (ELECTRODE) ×2 IMPLANT
PLATE 1/4 TUB W/COL 5H (Plate) ×1 IMPLANT
SCREW CORT ST 2.7X10 (Screw) ×1 IMPLANT
SCREW CORTEX 2.7X12MM (Screw) ×4 IMPLANT
SLEEVE SCD COMPRESS KNEE MED (MISCELLANEOUS) ×1 IMPLANT
SLING ARM FOAM STRAP XLG (SOFTGOODS) ×1 IMPLANT
SPLINT FIBERGLASS 4X30 (CAST SUPPLIES) ×1 IMPLANT
SPLINT PLASTER CAST XFAST 3X15 (CAST SUPPLIES) IMPLANT
SPLINT PLASTER XTRA FASTSET 3X (CAST SUPPLIES)
SPONGE GAUZE 4X4 12PLY (GAUZE/BANDAGES/DRESSINGS) ×2 IMPLANT
STOCKINETTE 4X48 STRL (DRAPES) ×2 IMPLANT
STRIP CLOSURE SKIN 1/2X4 (GAUZE/BANDAGES/DRESSINGS) ×1 IMPLANT
SUCTION FRAZIER TIP 10 FR DISP (SUCTIONS) ×1 IMPLANT
SUT BONE WAX W31G (SUTURE) IMPLANT
SUT MNCRL AB 3-0 PS2 18 (SUTURE) ×1 IMPLANT
SUT MNCRL AB 4-0 PS2 18 (SUTURE) ×1 IMPLANT
SUT VIC AB 1 CT1 27 (SUTURE)
SUT VIC AB 1 CT1 27XBRD ANBCTR (SUTURE) IMPLANT
SUT VICRYL 4-0 PS2 18IN ABS (SUTURE) ×2 IMPLANT
SYR BULB 3OZ (MISCELLANEOUS) ×2 IMPLANT
SYR CONTROL 10ML LL (SYRINGE) IMPLANT
TOWEL OR 17X24 6PK STRL BLUE (TOWEL DISPOSABLE) ×2 IMPLANT
TOWEL OR NON WOVEN STRL DISP B (DISPOSABLE) ×2 IMPLANT
TUBE CONNECTING 20X1/4 (TUBING) ×1 IMPLANT
UNDERPAD 30X30 INCONTINENT (UNDERPADS AND DIAPERS) ×2 IMPLANT
WATER STERILE IRR 1000ML POUR (IV SOLUTION) ×1 IMPLANT

## 2012-08-27 NOTE — Progress Notes (Signed)
Assisted Dr. Kasik with right, ultrasound guided, supraclavicular block. Side rails up, monitors on throughout procedure. See vital signs in flow sheet. Tolerated Procedure well. 

## 2012-08-27 NOTE — Transfer of Care (Signed)
Immediate Anesthesia Transfer of Care Note  Patient: Grant Mcdonald  Procedure(s) Performed: Procedure(s) (LRB) with comments: OPEN REDUCTION INTERNAL FIXATION (ORIF) METACARPAL (Right) - open reduction internal fixation right second metacarpal  Patient Location: PACU  Anesthesia Type: GA combined with regional for post-op pain  Level of Consciousness: sedated  Airway & Oxygen Therapy: Patient Spontanous Breathing and Patient connected to face mask oxygen  Post-op Assessment: Report given to PACU RN and Post -op Vital signs reviewed and stable  Post vital signs: Reviewed and stable  Complications: No apparent anesthesia complications

## 2012-08-27 NOTE — Anesthesia Procedure Notes (Addendum)
Anesthesia Regional Block:  Supraclavicular block  Pre-Anesthetic Checklist: ,, timeout performed, Correct Patient, Correct Site, Correct Laterality, Correct Procedure, Correct Position, site marked, Risks and benefits discussed,  Surgical consent,  Pre-op evaluation,  At surgeon's request and post-op pain management  Laterality: Right  Prep: chloraprep       Needles:  Injection technique: Single-shot  Needle Type: Echogenic Stimulator Needle     Needle Length: 5cm 5 cm Needle Gauge: 22 and 22 G    Additional Needles:  Procedures: ultrasound guided and nerve stimulator Supraclavicular block  Nerve Stimulator or Paresthesia:  Response: 0.5 mA,   Additional Responses:   Narrative:  Start time: 08/27/2012 11:53 AM End time: 08/27/2012 12:10 PM Injection made incrementally with aspirations every 3 mL. Anesthesiologist: Dr Gypsy Balsam  Additional Notes: 2956-2130 R Supraclav N Block POP CHG prep, sterile tech #22 stim/echo Arrow needle Stim down to .5ma, good echo visualization Marc .5% w/epi 25cc+decadron 4mg  infiltrated No compl-no air, art blood Dr Gypsy Balsam   Procedure Name: LMA Insertion Date/Time: 08/27/2012 1:32 PM Performed by: Caren Macadam Pre-anesthesia Checklist: Patient identified, Emergency Drugs available, Suction available and Patient being monitored Patient Re-evaluated:Patient Re-evaluated prior to inductionOxygen Delivery Method: Circle System Utilized Preoxygenation: Pre-oxygenation with 100% oxygen Intubation Type: IV induction Ventilation: Mask ventilation without difficulty LMA: LMA with gastric port inserted LMA Size: 5.0 Number of attempts: 1 Placement Confirmation: positive ETCO2 and breath sounds checked- equal and bilateral Tube secured with: Tape Dental Injury: Teeth and Oropharynx as per pre-operative assessment

## 2012-08-27 NOTE — H&P (Signed)
  PREOPERATIVE H&P  Chief Complaint: r hand pain and fracture  HPI: LUCILE DIDONATO is a 21 y.o. male who presents for evaluation of r n2nd metacarpal fx. It has been present for 2 weeks and has been worsening. He has failed conservative measures. Pain is rated as moderate.  Past Medical History  Diagnosis Date  . Type 2 diabetes mellitus not at goal   . Obesity   . Acanthosis nigricans, acquired   . Goiter   . Gynecomastia, male   . Hypertension   . Combined hyperlipidemia   . Abnormal liver function tests    History reviewed. No pertinent past surgical history. History   Social History  . Marital Status: Single    Spouse Name: N/A    Number of Children: N/A  . Years of Education: N/A   Social History Main Topics  . Smoking status: Former Smoker    Types: Cigarettes  . Smokeless tobacco: Current User    Types: Snuff  . Alcohol Use: No  . Drug Use: No  . Sexually Active: None   Other Topics Concern  . None   Social History Narrative  . None   Family History  Problem Relation Age of Onset  . Diabetes Father   . Obesity Father   . Diabetes Maternal Grandmother   . Thyroid disease Neg Hx    No Known Allergies Prior to Admission medications   Medication Sig Start Date End Date Taking? Authorizing Provider  metFORMIN (GLUCOPHAGE) 500 MG tablet Take 1 tablet (500 mg total) by mouth 2 (two) times daily with a meal. 06/23/12  Yes David Stall, MD  glucose blood (BAYER CONTOUR NEXT TEST) test strip Check sugar 10 x daily 06/23/12 06/23/13  David Stall, MD  lisinopril (PRINIVIL,ZESTRIL) 10 MG tablet Take 1 tablet (10 mg total) by mouth daily. 04/25/11 04/24/12  David Stall, MD     Positive ROS: none  All other systems have been reviewed and were otherwise negative with the exception of those mentioned in the HPI and as above.  Physical Exam: Filed Vitals:   08/27/12 1215  BP: 143/61  Pulse: 98  Temp:   Resp:     General: Alert, no acute  distress Cardiovascular: No pedal edema Respiratory: No cyanosis, no use of accessory musculature GI: No organomegaly, abdomen is soft and non-tender Skin: No lesions in the area of chief complaint Neurologic: Sensation intact distally Psychiatric: Patient is competent for consent with normal mood and affect Lymphatic: No axillary or cervical lymphadenopathy  MUSCULOSKELETAL: r hand painful rom mild rorational deformity r 2nd finger  X-RAY: 25 deg angulation 2nd metarcal fx   Assessment/Plan: right second metacarpal fracture Plan for Procedure(s): OPEN REDUCTION INTERNAL FIXATION (ORIF) METACARPAL  The risks benefits and alternatives were discussed with the patient including but not limited to the risks of nonoperative treatment, versus surgical intervention including infection, bleeding, nerve injury, malunion, nonunion, hardware prominence, hardware failure, need for hardware removal, blood clots, cardiopulmonary complications, morbidity, mortality, among others, and they were willing to proceed.  Predicted outcome is good, although there will be at least a six to nine month expected recovery.  Harvie Junior, MD 08/27/2012 12:47 PM

## 2012-08-27 NOTE — Brief Op Note (Signed)
08/27/2012  2:42 PM  PATIENT:  Grant Mcdonald  21 y.o. male  PRE-OPERATIVE DIAGNOSIS:  right second metacarpal fracture  POST-OPERATIVE DIAGNOSIS:  right second metacarpal fracture  PROCEDURE:  Procedure(s) (LRB) with comments: OPEN REDUCTION INTERNAL FIXATION (ORIF) METACARPAL (Right) - open reduction internal fixation right second metacarpal  SURGEON:  Surgeon(s) and Role:    * Harvie Junior, MD - Primary  PHYSICIAN ASSISTANT:   ASSISTANTS: bethune   ANESTHESIA:   general  EBL:  Total I/O In: 1500 [I.V.:1500] Out: -   BLOOD ADMINISTERED:none  DRAINS: none   LOCAL MEDICATIONS USED:  MARCAINE     SPECIMEN:  No Specimen  DISPOSITION OF SPECIMEN:  N/A  COUNTS:  YES  TOURNIQUET:   Total Tourniquet Time Documented: Forearm (Right) - 55 minutes  DICTATION: .Other Dictation: Dictation Number [id number]  PLAN OF CARE: Discharge to home after PACU  PATIENT DISPOSITION:  PACU - hemodynamically stable.   Delay start of Pharmacological VTE agent (>24hrs) due to surgical blood loss or risk of bleeding: no

## 2012-08-27 NOTE — Anesthesia Postprocedure Evaluation (Signed)
  Anesthesia Post-op Note  Patient: Grant Mcdonald  Procedure(s) Performed: Procedure(s) (LRB) with comments: OPEN REDUCTION INTERNAL FIXATION (ORIF) METACARPAL (Right) - open reduction internal fixation right second metacarpal  Patient Location: PACU  Anesthesia Type: GA combined with regional for post-op pain  Level of Consciousness: awake  Airway and Oxygen Therapy: Patient Spontanous Breathing  Post-op Pain: none  Post-op Assessment: Post-op Vital signs reviewed, Patient's Cardiovascular Status Stable, Respiratory Function Stable, Patent Airway, No signs of Nausea or vomiting and Pain level controlled  Post-op Vital Signs: stable  Complications: No apparent anesthesia complications

## 2012-08-27 NOTE — Anesthesia Preprocedure Evaluation (Addendum)
Anesthesia Evaluation  Patient identified by MRN, date of birth, ID band Patient awake    Reviewed: Allergy & Precautions, H&P , NPO status , Patient's Chart, lab work & pertinent test results  Airway Mallampati: II TM Distance: >3 FB Neck ROM: Full    Dental   Pulmonary  breath sounds clear to auscultation        Cardiovascular hypertension, Rhythm:Regular Rate:Normal     Neuro/Psych    GI/Hepatic   Endo/Other  diabetes  Renal/GU      Musculoskeletal   Abdominal   Peds  Hematology   Anesthesia Other Findings   Reproductive/Obstetrics                          Anesthesia Physical Anesthesia Plan  ASA: III  Anesthesia Plan: General   Post-op Pain Management: MAC Combined w/ Regional for Post-op pain   Induction: Intravenous  Airway Management Planned: LMA  Additional Equipment:   Intra-op Plan:   Post-operative Plan: Extubation in OR  Informed Consent: I have reviewed the patients History and Physical, chart, labs and discussed the procedure including the risks, benefits and alternatives for the proposed anesthesia with the patient or authorized representative who has indicated his/her understanding and acceptance.     Plan Discussed with: CRNA and Surgeon  Anesthesia Plan Comments:         Anesthesia Quick Evaluation

## 2012-08-28 NOTE — Op Note (Signed)
NAMEBRAISON, SNOKE               ACCOUNT NO.:  [id number]  MEDICAL RECORD NO.:  [id number]  LOCATION:                               FACILITY:  [facility]  PHYSICIAN:  Harvie Junior, M.D.        DATE OF BIRTH:  DATE OF PROCEDURE:  08/27/2012 DATE OF DISCHARGE:  08/27/2012                              OPERATIVE REPORT   PREOPERATIVE DIAGNOSIS:  Fracture second metacarpal with angulation, right.  POSTOPERATIVE DIAGNOSIS:  Fracture second metacarpal with angulation, right.  PROCEDURE:  Open reduction and internal fixation of right second metacarpal fracture.  SURGEON:  Harvie Junior, M.D.  ASSISTANT:  Marshia Ly, P.A.  ANESTHESIA:  General.  BRIEF HISTORY:  [name] is a 21 year old male with a history of having fallen and suffering a right metacarpal fracture.  He had a delay to diagnosis of about 2 weeks.  He was ultimately diagnosed with second metacarpal fracture and presented to our office.  X-rays at that point unfortunately showed about 25 degrees of angulation of the second metacarpal fracture.  We talked about treatment options.  We felt that we really needed to get an anatomic reduction of this being from the radial side of his hand and she was brought to the operating room for fixation of this.  PROCEDURE:  The patient was brought to the operating room.  After adequate anesthesia was achieved with general anesthetic, the patient was placed supine on the operating table.  The right upper extremity was prepped and draped in usual sterile fashion.  Following this, the arm was exsanguinated, blood pressure inflated to 250 mmHg.  Following this, an incision was made over the second metacarpal with care being taken to space away from a small wound on the back side of his hand and we used fluoro to establish the position of the incision.  Once this was done, subcutaneous tissue down the level of the dorsal periosteum of the extensor mechanism was retracted and the  periosteum was opened.  The fracture was identified, manipulated into an anatomic position, and a 5 hole one-third tubular plate was placed, used T7 hardware just because of his young age, we felt that getting better fixation would be appropriate and we used two 7 hardware to get the anatomic reduction of his metacarpal.  Rotational alignment appeared to be perfect and at this point, the wound was irrigated and suctioned dry.  We closed the periosteum around the plate and closed completely and then checked again the extensor mechanism, it was gliding fine.  At this point, the wound was irrigated, suctioned dry, closed with 3-0 Monocryl subcuticular.  Benzoin and Steri-Strips applied.  Sterile compressive dressing was applied.  The patient was taken to recovery and was noted to be in satisfactory condition.  Estimated blood loss for the procedure was none.     Harvie Junior, M.D.     Ranae Plumber  D:  08/27/2012  T:  08/28/2012  Job:  [id number]

## 2012-12-24 ENCOUNTER — Ambulatory Visit: Payer: BC Managed Care – PPO | Admitting: "Endocrinology

## 2013-12-22 ENCOUNTER — Ambulatory Visit (INDEPENDENT_AMBULATORY_CARE_PROVIDER_SITE_OTHER): Payer: No Typology Code available for payment source | Admitting: Emergency Medicine

## 2013-12-22 VITALS — BP 120/70 | HR 108 | Temp 98.0°F | Resp 16 | Ht 74.0 in | Wt 224.0 lb

## 2013-12-22 DIAGNOSIS — L0231 Cutaneous abscess of buttock: Secondary | ICD-10-CM

## 2013-12-22 DIAGNOSIS — L03317 Cellulitis of buttock: Principal | ICD-10-CM

## 2013-12-22 MED ORDER — ACETAMINOPHEN-CODEINE #3 300-30 MG PO TABS: 1.0000 | ORAL_TABLET | ORAL | Status: DC | PRN

## 2013-12-22 MED ORDER — SULFAMETHOXAZOLE-TMP DS 800-160 MG PO TABS: 1.0000 | ORAL_TABLET | Freq: Two times a day (BID) | ORAL | Status: DC

## 2013-12-22 NOTE — Progress Notes (Signed)
[facility] [address], [place] [address] [phone]  Date:  12/22/2013   Name:  Grant HarbourMichael D Mcdonald   DOB:  [birth year]   MRN:  [id number]  PCP:  No PCP Per Patient    Chief Complaint: Cyst   History of Present Illness:  Grant Mcdonald is a 23 y.o. very pleasant male patient who presents with the following:  Has a pain full mass on his right and left buttock.  Right is much larger.  No fever or chills.  No change in sugar with abscesses.  No history of injury. No improvement with over the counter medications or other home remedies. Denies other complaint or health concern today.   Patient Active Problem List   Diagnosis Date Noted  . Type 2 diabetes mellitus not at goal   . Acanthosis nigricans, acquired   . Goiter   . Gynecomastia, male   . Hypertension   . Combined hyperlipidemia   . Abnormal liver function tests   . Obesity 03/14/2011  . Mixed hyperlipidemia 03/14/2011    Past Medical History  Diagnosis Date  . Type 2 diabetes mellitus not at goal   . Obesity   . Acanthosis nigricans, acquired   . Goiter   . Gynecomastia, male   . Hypertension   . Combined hyperlipidemia   . Abnormal liver function tests     History reviewed. No pertinent past surgical history.  History  Substance Use Topics  . Smoking status: Former Smoker    Types: Cigarettes  . Smokeless tobacco: Current User    Types: Snuff  . Alcohol Use: No    Family History  Problem Relation Age of Onset  . Diabetes Father   . Obesity Father   . Diabetes Maternal Grandmother   . Thyroid disease Neg Hx     No Known Allergies  Medication list has been reviewed and updated.  Current Outpatient Prescriptions on File Prior to Visit  Medication Sig Dispense Refill  . lisinopril (PRINIVIL,ZESTRIL) 10 MG tablet Take 1 tablet (10 mg total) by mouth daily.  30 tablet  11  . metFORMIN (GLUCOPHAGE) 500 MG tablet Take 1 tablet (500 mg total) by mouth 2 (two) times daily with a  meal.  60 tablet  4  . oxyCODONE-acetaminophen (PERCOCET/ROXICET) 5-325 MG per tablet Take 1 tablet by mouth every 4 (four) hours as needed for pain.  30 tablet  0   No current facility-administered medications on file prior to visit.    Review of Systems:  As per HPI, otherwise negative.    Physical Examination: Filed Vitals:   12/22/13 1104  BP: 120/70  Pulse: 108  Temp: 98 F (36.7 C)  Resp: 16   Filed Vitals:   12/22/13 1104  Height: 6\' 2"  (1.88 m)  Weight: 224 lb (101.606 kg)   Body mass index is 28.75 kg/(m^2). Ideal Body Weight: Weight in (lb) to have BMI = 25: 194.3   GEN: WDWN, NAD, Non-toxic, Alert & Oriented x 3 HEENT: Atraumatic, Normocephalic.  Ears and Nose: No external deformity. EXTR: No clubbing/cyanosis/edema NEURO: Normal gait.  PSYCH: Normally interactive. Conversant. Not depressed or anxious appearing.  Calm demeanor.    Assessment and Plan: Buttock abscess Not mature to drain Septra Local heat  Tylenol #3  Signed,  Phillips OdorJeffery Anderson, MD

## 2013-12-22 NOTE — Patient Instructions (Signed)
Abscess An abscess is an infected area that contains a collection of pus and debris.It can occur in almost any part of the body. An abscess is also known as a furuncle or boil. CAUSES  An abscess occurs when tissue gets infected. This can occur from blockage of oil or sweat glands, infection of hair follicles, or a minor injury to the skin. As the body tries to fight the infection, pus collects in the area and creates pressure under the skin. This pressure causes pain. People with weakened immune systems have difficulty fighting infections and get certain abscesses more often.  SYMPTOMS Usually an abscess develops on the skin and becomes a painful mass that is red, warm, and tender. If the abscess forms under the skin, you may feel a moveable soft area under the skin. Some abscesses break open (rupture) on their own, but most will continue to get worse without care. The infection can spread deeper into the body and eventually into the bloodstream, causing you to feel ill.  DIAGNOSIS  Your caregiver will take your medical history and perform a physical exam. A sample of fluid may also be taken from the abscess to determine what is causing your infection. TREATMENT  Your caregiver may prescribe antibiotic medicines to fight the infection. However, taking antibiotics alone usually does not cure an abscess. Your caregiver may need to make a small cut (incision) in the abscess to drain the pus. In some cases, gauze is packed into the abscess to reduce pain and to continue draining the area. HOME CARE INSTRUCTIONS   Only take over-the-counter or prescription medicines for pain, discomfort, or fever as directed by your caregiver.  If you were prescribed antibiotics, take them as directed. Finish them even if you start to feel better.  If gauze is used, follow your caregiver's directions for changing the gauze.  To avoid spreading the infection:  Keep your draining abscess covered with a  bandage.  Wash your hands well.  Do not share personal care items, towels, or whirlpools with others.  Avoid skin contact with others.  Keep your skin and clothes clean around the abscess.  Keep all follow-up appointments as directed by your caregiver. SEEK MEDICAL CARE IF:   You have increased pain, swelling, redness, fluid drainage, or bleeding.  You have muscle aches, chills, or a general ill feeling.  You have a fever. MAKE SURE YOU:   Understand these instructions.  Will watch your condition.  Will get help right away if you are not doing well or get worse. Document Released: 08/15/2005 Document Revised: 05/06/2012 Document Reviewed: 01/18/2012 [employer] Patient Information 2014 [employer].  

## 2013-12-25 ENCOUNTER — Ambulatory Visit (INDEPENDENT_AMBULATORY_CARE_PROVIDER_SITE_OTHER): Payer: No Typology Code available for payment source | Admitting: Emergency Medicine

## 2013-12-25 VITALS — BP 116/72 | HR 98 | Temp 98.0°F | Resp 18 | Ht 74.5 in | Wt 220.0 lb

## 2013-12-25 DIAGNOSIS — L0231 Cutaneous abscess of buttock: Secondary | ICD-10-CM

## 2013-12-25 DIAGNOSIS — L03317 Cellulitis of buttock: Principal | ICD-10-CM

## 2013-12-25 DIAGNOSIS — R52 Pain, unspecified: Secondary | ICD-10-CM

## 2013-12-25 MED ORDER — ACETAMINOPHEN-CODEINE #3 300-30 MG PO TABS: 1.0000 | ORAL_TABLET | ORAL | Status: DC | PRN

## 2013-12-25 MED ORDER — SULFAMETHOXAZOLE-TMP DS 800-160 MG PO TABS: 2.0000 | ORAL_TABLET | Freq: Two times a day (BID) | ORAL | Status: DC

## 2013-12-25 NOTE — Progress Notes (Signed)
Procedure Note: Verbal consent obtained.  Local anesthesia with 2 cc 2% lidocaine.  Betadine prep.  Incision with 11 blade.  Copious purulence expressed.  Abscess explored, breaking up loculations.  Wound irrigated with approx 15cc 2% plain lidocaine.  Packed with 1/4 inch plain packing.  Cleansed and dressed.  Discussed wound care.  Pt tolerated very well.  RTC 48 hours for wound care.

## 2013-12-25 NOTE — Progress Notes (Signed)
[facility] [address], [place] [id number] [phone]  Date:  12/25/2013   Name:  Grant HarbourMichael D Mcdonald   DOB:  Aug 31, 1991   MRN:  [id number]  PCP:  No PCP Per Patient    Chief Complaint: Follow-up   History of Present Illness:  Grant Mcdonald is a 23 y.o. very pleasant male patient who presents with the following:  Seen last week for abscess on right buttock.  No fever or chills.  No nausea or vomiting.    Patient Active Problem List   Diagnosis Date Noted  . Type 2 diabetes mellitus not at goal   . Acanthosis nigricans, acquired   . Goiter   . Gynecomastia, male   . Hypertension   . Combined hyperlipidemia   . Abnormal liver function tests   . Obesity 03/14/2011  . Mixed hyperlipidemia 03/14/2011    Past Medical History  Diagnosis Date  . Type 2 diabetes mellitus not at goal   . Obesity   . Acanthosis nigricans, acquired   . Goiter   . Gynecomastia, male   . Hypertension   . Combined hyperlipidemia   . Abnormal liver function tests     History reviewed. No pertinent past surgical history.  History  Substance Use Topics  . Smoking status: Former Smoker    Types: Cigarettes  . Smokeless tobacco: Current User    Types: Snuff  . Alcohol Use: No    Family History  Problem Relation Age of Onset  . Diabetes Father   . Obesity Father   . Diabetes Maternal Grandmother   . Thyroid disease Neg Hx     No Known Allergies  Medication list has been reviewed and updated.  Current Outpatient Prescriptions on File Prior to Visit  Medication Sig Dispense Refill  . acetaminophen-codeine (TYLENOL #3) 300-30 MG per tablet Take 1-2 tablets by mouth every 4 (four) hours as needed.  30 tablet  0  . sulfamethoxazole-trimethoprim (BACTRIM DS) 800-160 MG per tablet Take 1 tablet by mouth 2 (two) times daily.  20 tablet  0  . lisinopril (PRINIVIL,ZESTRIL) 10 MG tablet Take 1 tablet (10 mg total) by mouth daily.  30 tablet  11  . metFORMIN (GLUCOPHAGE)  500 MG tablet Take 1 tablet (500 mg total) by mouth 2 (two) times daily with a meal.  60 tablet  4  . oxyCODONE-acetaminophen (PERCOCET/ROXICET) 5-325 MG per tablet Take 1 tablet by mouth every 4 (four) hours as needed for pain.  30 tablet  0   No current facility-administered medications on file prior to visit.    Review of Systems:  As per HPI, otherwise negative.    Physical Examination: Filed Vitals:   12/25/13 0921  BP: 116/72  Pulse: 98  Temp: 98 F (36.7 C)  Resp: 18   Filed Vitals:   12/25/13 0921  Height: 6' 2.5" (1.892 m)  Weight: 220 lb (99.791 kg)   Body mass index is 27.88 kg/(m^2). Ideal Body Weight: Weight in (lb) to have BMI = 25: 196.9   GEN: WDWN, NAD, Non-toxic, Alert & Oriented x 3 HEENT: Atraumatic, Normocephalic.  Ears and Nose: No external deformity. EXTR: No clubbing/cyanosis/edema NEURO: Normal gait.  PSYCH: Normally interactive. Conversant. Not depressed or anxious appearing.  Calm demeanor.  Abscess right buttock extending into left.  Aspirated 5 ml purulent material.    Assessment and Plan: Abscess buttock I&D Continue antibiotic  Signed,  Phillips OdorJeffery Hershel Corkery, MD

## 2013-12-27 ENCOUNTER — Ambulatory Visit (INDEPENDENT_AMBULATORY_CARE_PROVIDER_SITE_OTHER): Payer: No Typology Code available for payment source | Admitting: Physician Assistant

## 2013-12-27 VITALS — BP 126/76 | HR 78 | Temp 98.6°F | Resp 18 | Ht 75.75 in | Wt 224.0 lb

## 2013-12-27 DIAGNOSIS — L0501 Pilonidal cyst with abscess: Secondary | ICD-10-CM

## 2013-12-27 LAB — WOUND CULTURE: Gram Stain: NONE SEEN

## 2013-12-27 NOTE — Progress Notes (Signed)
   Subjective:    Patient ID: Grant Mcdonald, male    DOB: [birth year], 23 y.o.   MRN: [id number]  Wound Check     [name] is a pleasant 23 yr old male here for follow up on pilonidal abscess drained here 48 hours ago.  He is much improved.  Able to sit more comfortably.  He is changing the dressing faithfully and is seeing less drainage.  Take the abx as directed and tolerating them well.  No fever, chills, nausea, vomiting  Review of Systems  Constitutional: Negative for fever and chills.  Respiratory: Negative.   Cardiovascular: Negative.   Gastrointestinal: Negative for nausea and vomiting.  Skin: Positive for wound.       Objective:   Physical Exam  Vitals reviewed. Constitutional: He is oriented to person, place, and time. He appears well-developed and well-nourished. No distress.  HENT:  Head: Normocephalic and atraumatic.  Eyes: Conjunctivae are normal. No scleral icterus.  Neurological: He is alert and oriented to person, place, and time.  Skin: Skin is warm and dry.     Healing wound at right buttock; resolving erythema, induration  Psychiatric: He has a normal mood and affect. His behavior is normal.    Wound Care: Dressing removed.  Packing not in place.  Small amount of debris removed with pick ups.  Unable to express drainage.  Repacked with 1/4" plain packing.  Dressing applied.        Assessment & Plan:  Pilonidal abscess   [name] is a pleasant 23 yr old male here for wound care following I&D of pilonidal abscess.  He is much improved.  Repacked today.  RTC 48 hours for recheck - may not require further packing at that time.  Cx still prelim.  For now, continue current dose of TMP/SMX (2 DS tabs BID) will adjust if necessary based on final cx results.  Note provided for missed work days last week.   Loleta DickerE. Elizabeth Asa Fath MHS, PA-C [facility] 2/8/20156:25 PM

## 2013-12-30 ENCOUNTER — Ambulatory Visit (INDEPENDENT_AMBULATORY_CARE_PROVIDER_SITE_OTHER): Payer: No Typology Code available for payment source | Admitting: Physician Assistant

## 2013-12-30 VITALS — BP 120/52 | HR 96 | Temp 97.5°F | Resp 16 | Ht 74.0 in | Wt 224.0 lb

## 2013-12-30 DIAGNOSIS — L0501 Pilonidal cyst with abscess: Secondary | ICD-10-CM

## 2013-12-30 NOTE — Progress Notes (Signed)
   Subjective:    Patient ID: Grant Mcdonald, male    DOB: Dec 13, 1990, 23 y.o.   MRN: [id number]  HPI   [name] is a 23 yr old male here for wound care following drainage of a pilonidal abscess.  He reports he continues to improve.  Minimal pain but is having itching.  He has not changed the dressing since last visit.  Grandmother, who has been doing dressing changes, did not feel it needed to be changed.  He continue the antibiotics and is tolerating them well.   Review of Systems  Constitutional: Negative for fever and chills.  Respiratory: Negative.   Cardiovascular: Negative.   Gastrointestinal: Negative.   Skin: Positive for wound.       Objective:   Physical Exam  Vitals reviewed. Constitutional: He is oriented to person, place, and time. He appears well-developed and well-nourished. No distress.  HENT:  Head: Normocephalic and atraumatic.  Eyes: Conjunctivae are normal. No scleral icterus.  Pulmonary/Chest: Effort normal.  Neurological: He is alert and oriented to person, place, and time.  Skin: Skin is warm and dry.     Healing abscess at right buttock; non tender; wound bed healthy; no need for further packing  Psychiatric: He has a normal mood and affect. His behavior is normal.       Assessment & Plan:  Pilonidal abscess   [name] is a very pleasant 23 yr old male here for wound care following I&D of a pilonidal abscess.  The wound is healing very well.  No need for further packing today.  Continue daily dressing changes until completely healed.  Finish abx.  RTC if concerns.   Loleta DickerE. Elizabeth Christien Berthelot MHS, PA-C [facility] 2/11/201510:13 AM

## 2014-11-21 ENCOUNTER — Ambulatory Visit (INDEPENDENT_AMBULATORY_CARE_PROVIDER_SITE_OTHER): Payer: BLUE CROSS/BLUE SHIELD

## 2014-11-21 ENCOUNTER — Ambulatory Visit (INDEPENDENT_AMBULATORY_CARE_PROVIDER_SITE_OTHER): Payer: BLUE CROSS/BLUE SHIELD | Admitting: Family Medicine

## 2014-11-21 VITALS — BP 130/78 | HR 98 | Temp 98.0°F | Resp 18 | Ht 74.0 in | Wt 225.6 lb

## 2014-11-21 DIAGNOSIS — S60031A Contusion of right middle finger without damage to nail, initial encounter: Secondary | ICD-10-CM
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
## 2014-11-29 ENCOUNTER — Other Ambulatory Visit: Payer: Self-pay | Admitting: Orthopedic Surgery

## 2014-11-29 ENCOUNTER — Encounter (HOSPITAL_BASED_OUTPATIENT_CLINIC_OR_DEPARTMENT_OTHER): Payer: Self-pay | Admitting: *Deleted

## 2014-11-30 ENCOUNTER — Ambulatory Visit (HOSPITAL_BASED_OUTPATIENT_CLINIC_OR_DEPARTMENT_OTHER)
Admission: RE | Admit: 2014-11-30 | Discharge: 2014-11-30 | Disposition: A | Discharge status: Home / Self Care | Payer: BLUE CROSS/BLUE SHIELD | Source: Ambulatory Visit | Attending: Orthopedic Surgery | Admitting: Orthopedic Surgery

## 2014-11-30 ENCOUNTER — Ambulatory Visit (HOSPITAL_BASED_OUTPATIENT_CLINIC_OR_DEPARTMENT_OTHER): Payer: BLUE CROSS/BLUE SHIELD | Admitting: Anesthesiology

## 2014-11-30 ENCOUNTER — Ambulatory Visit (HOSPITAL_COMMUNITY): Payer: BLUE CROSS/BLUE SHIELD

## 2014-11-30 ENCOUNTER — Encounter (HOSPITAL_BASED_OUTPATIENT_CLINIC_OR_DEPARTMENT_OTHER): Payer: Self-pay | Admitting: Anesthesiology

## 2014-11-30 ENCOUNTER — Encounter (HOSPITAL_BASED_OUTPATIENT_CLINIC_OR_DEPARTMENT_OTHER)
Admission: RE | Disposition: A | Discharge status: Home / Self Care | Payer: Self-pay | Source: Ambulatory Visit | Attending: Orthopedic Surgery

## 2014-11-30 DIAGNOSIS — W2211XA Striking against or struck by driver side automobile airbag, initial encounter: Secondary | ICD-10-CM | POA: Insufficient documentation

## 2014-11-30 DIAGNOSIS — Z833 Family history of diabetes mellitus: Secondary | ICD-10-CM | POA: Insufficient documentation

## 2014-11-30 DIAGNOSIS — T148XXA Other injury of unspecified body region, initial encounter: Secondary | ICD-10-CM

## 2014-11-30 DIAGNOSIS — Y999 Unspecified external cause status: Secondary | ICD-10-CM | POA: Insufficient documentation

## 2014-11-30 DIAGNOSIS — E119 Type 2 diabetes mellitus without complications: Secondary | ICD-10-CM | POA: Diagnosis not present

## 2014-11-30 DIAGNOSIS — Y939 Activity, unspecified: Secondary | ICD-10-CM | POA: Diagnosis not present

## 2014-11-30 DIAGNOSIS — S62612A Displaced fracture of proximal phalanx of right middle finger, initial encounter for closed fracture: Secondary | ICD-10-CM | POA: Insufficient documentation

## 2014-11-30 DIAGNOSIS — Y929 Unspecified place or not applicable: Secondary | ICD-10-CM | POA: Diagnosis not present

## 2014-11-30 DIAGNOSIS — S6991XA Unspecified injury of right wrist, hand and finger(s), initial encounter: Secondary | ICD-10-CM | POA: Diagnosis present

## 2014-11-30 DIAGNOSIS — F17210 Nicotine dependence, cigarettes, uncomplicated: Secondary | ICD-10-CM | POA: Insufficient documentation

## 2014-11-30 HISTORY — DX: Fracture of unspecified phalanx of unspecified finger, initial encounter for closed fracture: S62.609A

## 2014-11-30 HISTORY — DX: Type 2 diabetes mellitus without complications: E11.9

## 2014-11-30 HISTORY — PX: OPEN REDUCTION INTERNAL FIXATION (ORIF) DISTAL PHALANX: SHX6236

## 2014-11-30 LAB — GLUCOSE, CAPILLARY
Glucose-Capillary: 213 mg/dL — ABNORMAL HIGH (ref 70–99)
Glucose-Capillary: 205 mg/dL — ABNORMAL HIGH (ref 70–99)

## 2014-11-30 LAB — POCT HEMOGLOBIN-HEMACUE: Hemoglobin: 16.4 g/dL (ref 13.0–17.0)

## 2014-11-30 SURGERY — OPEN REDUCTION INTERNAL FIXATION (ORIF) DISTAL PHALANX
Anesthesia: General | Laterality: Right

## 2014-11-30 MED ORDER — OXYCODONE HCL 5 MG PO TABS: 5.0000 mg | ORAL_TABLET | Freq: Once | ORAL | Status: DC | PRN

## 2014-11-30 MED ORDER — BUPIVACAINE-EPINEPHRINE (PF) 0.5% -1:200000 IJ SOLN
INTRAMUSCULAR | Status: AC
  Filled 2014-11-30: qty 30

## 2014-11-30 MED ORDER — PROPOFOL 10 MG/ML IV BOLUS
INTRAVENOUS | Status: DC | PRN
  Administered 2014-11-30: 250 mg via INTRAVENOUS

## 2014-11-30 MED ORDER — FENTANYL CITRATE 0.05 MG/ML IJ SOLN
INTRAMUSCULAR | Status: DC | PRN
  Administered 2014-11-30 (×2): 25 ug via INTRAVENOUS
  Administered 2014-11-30: 100 ug via INTRAVENOUS
  Administered 2014-11-30 (×2): 25 ug via INTRAVENOUS

## 2014-11-30 MED ORDER — MIDAZOLAM HCL 2 MG/2ML IJ SOLN
INTRAMUSCULAR | Status: AC
  Filled 2014-11-30: qty 2

## 2014-11-30 MED ORDER — MIDAZOLAM HCL 2 MG/2ML IJ SOLN: 1.0000 mg | INTRAMUSCULAR | Status: DC | PRN

## 2014-11-30 MED ORDER — ONDANSETRON HCL 4 MG/2ML IJ SOLN
INTRAMUSCULAR | Status: DC | PRN
  Administered 2014-11-30: 4 mg via INTRAVENOUS

## 2014-11-30 MED ORDER — MIDAZOLAM HCL 2 MG/ML PO SYRP: 12.0000 mg | ORAL_SOLUTION | Freq: Once | ORAL | Status: DC | PRN

## 2014-11-30 MED ORDER — LIDOCAINE HCL (CARDIAC) 20 MG/ML IV SOLN
INTRAVENOUS | Status: DC | PRN
  Administered 2014-11-30: 100 mg via INTRAVENOUS

## 2014-11-30 MED ORDER — BUPIVACAINE-EPINEPHRINE (PF) 0.5% -1:200000 IJ SOLN
INTRAMUSCULAR | Status: AC
  Filled 2014-11-30: qty 1.8

## 2014-11-30 MED ORDER — LACTATED RINGERS IV SOLN
INTRAVENOUS | Status: DC
  Administered 2014-11-30 (×2): via INTRAVENOUS

## 2014-11-30 MED ORDER — MIDAZOLAM HCL 5 MG/5ML IJ SOLN
INTRAMUSCULAR | Status: DC | PRN
  Administered 2014-11-30: 2 mg via INTRAVENOUS

## 2014-11-30 MED ORDER — LIDOCAINE HCL (PF) 1 % IJ SOLN
INTRAMUSCULAR | Status: AC
  Filled 2014-11-30: qty 30

## 2014-11-30 MED ORDER — OXYCODONE HCL 5 MG/5ML PO SOLN: 5.0000 mg | Freq: Once | ORAL | Status: DC | PRN

## 2014-11-30 MED ORDER — BUPIVACAINE-EPINEPHRINE 0.5% -1:200000 IJ SOLN
INTRAMUSCULAR | Status: DC | PRN
  Administered 2014-11-30: 19 mL

## 2014-11-30 MED ORDER — HYDROMORPHONE HCL 1 MG/ML IJ SOLN: 0.2500 mg | INTRAMUSCULAR | Status: DC | PRN

## 2014-11-30 MED ORDER — LACTATED RINGERS IV SOLN: INTRAVENOUS | Status: DC

## 2014-11-30 MED ORDER — FENTANYL CITRATE 0.05 MG/ML IJ SOLN
INTRAMUSCULAR | Status: AC
  Filled 2014-11-30: qty 6

## 2014-11-30 MED ORDER — CEFAZOLIN SODIUM-DEXTROSE 2-3 GM-% IV SOLR
2.0000 g | INTRAVENOUS | Status: AC
  Administered 2014-11-30: 2 g via INTRAVENOUS

## 2014-11-30 MED ORDER — ONDANSETRON HCL 4 MG/2ML IJ SOLN: 4.0000 mg | Freq: Once | INTRAMUSCULAR | Status: DC | PRN

## 2014-11-30 MED ORDER — CEFAZOLIN SODIUM-DEXTROSE 2-3 GM-% IV SOLR
INTRAVENOUS | Status: AC
  Filled 2014-11-30: qty 50

## 2014-11-30 MED ORDER — FENTANYL CITRATE 0.05 MG/ML IJ SOLN
INTRAMUSCULAR | Status: AC
  Filled 2014-11-30: qty 4

## 2014-11-30 MED ORDER — FENTANYL CITRATE 0.05 MG/ML IJ SOLN: 50.0000 ug | INTRAMUSCULAR | Status: DC | PRN

## 2014-11-30 SURGICAL SUPPLY — 66 items
BANDAGE COBAN STERILE 2 (GAUZE/BANDAGES/DRESSINGS) IMPLANT
BIT DRILL 1.0 W/MINI QC (BIT) ×1 IMPLANT
BIT DRILL 1.3 (BIT) ×2
BIT DRILL 1.3XMNQK CNCT DISP (BIT) IMPLANT
BIT DRL 1.3XMNQK CNCT DISP (BIT) ×1
BLADE MINI RND TIP GREEN BEAV (BLADE) IMPLANT
BLADE SURG 15 STRL LF DISP TIS (BLADE) ×1 IMPLANT
BLADE SURG 15 STRL SS (BLADE) ×2
BNDG CMPR 9X4 STRL LF SNTH (GAUZE/BANDAGES/DRESSINGS) ×1
BNDG COHESIVE 1X5 TAN STRL LF (GAUZE/BANDAGES/DRESSINGS) IMPLANT
BNDG COHESIVE 4X5 TAN STRL (GAUZE/BANDAGES/DRESSINGS) ×1 IMPLANT
BNDG CONFORM 2 STRL LF (GAUZE/BANDAGES/DRESSINGS) IMPLANT
BNDG ESMARK 4X9 LF (GAUZE/BANDAGES/DRESSINGS) ×1 IMPLANT
BNDG GAUZE ELAST 4 BULKY (GAUZE/BANDAGES/DRESSINGS) ×2 IMPLANT
CHLORAPREP W/TINT 26ML (MISCELLANEOUS) ×2 IMPLANT
CORDS BIPOLAR (ELECTRODE) IMPLANT
COVER BACK TABLE 60X90IN (DRAPES) ×2 IMPLANT
COVER MAYO STAND STRL (DRAPES) ×2 IMPLANT
CUFF TOURNIQUET SINGLE 18IN (TOURNIQUET CUFF) ×1 IMPLANT
DEPRESSOR TONGUE BLADE STERILE (MISCELLANEOUS) IMPLANT
DRAPE C-ARM 42X72 X-RAY (DRAPES) ×2 IMPLANT
DRAPE EXTREMITY T 121X128X90 (DRAPE) ×2 IMPLANT
DRAPE SURG 17X23 STRL (DRAPES) ×2 IMPLANT
DRSG EMULSION OIL 3X3 NADH (GAUZE/BANDAGES/DRESSINGS) ×2 IMPLANT
GAUZE SPONGE 4X4 12PLY STRL (GAUZE/BANDAGES/DRESSINGS) ×2 IMPLANT
GLOVE BIO SURGEON STRL SZ7.5 (GLOVE) ×2 IMPLANT
GLOVE BIOGEL PI IND STRL 7.0 (GLOVE) ×1 IMPLANT
GLOVE BIOGEL PI IND STRL 8 (GLOVE) ×1 IMPLANT
GLOVE BIOGEL PI INDICATOR 7.0 (GLOVE) ×1
GLOVE BIOGEL PI INDICATOR 8 (GLOVE) ×1
GLOVE ECLIPSE 6.5 STRL STRAW (GLOVE) ×2 IMPLANT
GLOVE SURG SS PI 7.0 STRL IVOR (GLOVE) ×1 IMPLANT
GOWN STRL REUS W/ TWL LRG LVL3 (GOWN DISPOSABLE) ×2 IMPLANT
GOWN STRL REUS W/TWL LRG LVL3 (GOWN DISPOSABLE) ×4
GOWN STRL REUS W/TWL XL LVL3 (GOWN DISPOSABLE) ×2 IMPLANT
K-WIRE .045X6 DBL TRO NS (WIRE) ×4
KWIRE .045X6 DBL TRO NS (WIRE) IMPLANT
NEEDLE HYPO 22GX1.5 SAFETY (NEEDLE) ×1 IMPLANT
NS IRRIG 1000ML POUR BTL (IV SOLUTION) ×2 IMPLANT
PACK BASIN DAY SURGERY FS (CUSTOM PROCEDURE TRAY) ×2 IMPLANT
PAD CAST 4YDX4 CTTN HI CHSV (CAST SUPPLIES) IMPLANT
PADDING CAST ABS 4INX4YD NS (CAST SUPPLIES)
PADDING CAST ABS COTTON 4X4 ST (CAST SUPPLIES) IMPLANT
PADDING CAST COTTON 4X4 STRL (CAST SUPPLIES) ×2
PLATE STRAIGHT LOCK 1.5 (Plate) ×1 IMPLANT
RUBBERBAND STERILE (MISCELLANEOUS) IMPLANT
SCREW CORTICAL 1.5X9MM WRIST (Screw) ×1 IMPLANT
SCREW L 1.5X12 (Screw) ×1 IMPLANT
SCREW LOCKING 1.5X10 (Screw) ×1 IMPLANT
SCREW LOCKING 1.5X11MM (Screw) ×1 IMPLANT
SCREW LOCKING 1.5X13MM (Screw) ×1 IMPLANT
SCREW LOCKING 1.5X8 (Screw) ×1 IMPLANT
SCREW NL 1.5X12 (Screw) ×1 IMPLANT
STOCKINETTE 6  STRL (DRAPES) ×1
STOCKINETTE 6 STRL (DRAPES) ×1 IMPLANT
SUT CHROMIC 6 0 PS 4 (SUTURE) ×2 IMPLANT
SUT ETHILON 4 0 PS 2 18 (SUTURE) ×2 IMPLANT
SUT VIC AB 3-0 FS2 27 (SUTURE) ×1 IMPLANT
SUT VICRYL RAPIDE 4-0 (SUTURE) ×2 IMPLANT
SUT VICRYL RAPIDE 4/0 PS 2 (SUTURE) IMPLANT
SYR BULB 3OZ (MISCELLANEOUS) ×1 IMPLANT
SYRINGE 10CC LL (SYRINGE) ×1 IMPLANT
TOWEL OR 17X24 6PK STRL BLUE (TOWEL DISPOSABLE) ×2 IMPLANT
TOWEL OR NON WOVEN STRL DISP B (DISPOSABLE) IMPLANT
TUBE CONNECTING 20X1/4 (TUBING) ×1 IMPLANT
UNDERPAD 30X30 INCONTINENT (UNDERPADS AND DIAPERS) ×2 IMPLANT

## 2014-11-30 NOTE — Anesthesia Postprocedure Evaluation (Signed)
  Anesthesia Post-op Note  Patient: Toribio HarbourMichael D Wiedel  Procedure(s) Performed: Procedure(s): OPEN REDUCTION INTERNAL FIXATION  RIGHT LONG PHALANX FRACTURE (Right)  Patient Location: PACU  Anesthesia Type: General   Level of Consciousness: awake, alert  and oriented  Airway and Oxygen Therapy: Patient Spontanous Breathing  Post-op Pain: none  Post-op Assessment: Post-op Vital signs reviewed  Post-op Vital Signs: Reviewed  Last Vitals:  Filed Vitals:   11/30/14 1504  BP: 136/77  Pulse: 88  Temp: 36.7 C  Resp: 18    Complications: No apparent anesthesia complications

## 2014-11-30 NOTE — Transfer of Care (Signed)
Immediate Anesthesia Transfer of Care Note  Patient: Grant Mcdonald  Procedure(s) Performed: Procedure(s): OPEN REDUCTION INTERNAL FIXATION  RIGHT LONG PHALANX FRACTURE (Right)  Patient Location: PACU  Anesthesia Type:General  Level of Consciousness: awake, sedated and patient cooperative  Airway & Oxygen Therapy: Patient Spontanous Breathing and Patient connected to face mask oxygen  Post-op Assessment: Report given to PACU RN and Post -op Vital signs reviewed and stable  Post vital signs: Reviewed and stable  Complications: No apparent anesthesia complications
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
## 2014-11-30 NOTE — Discharge Instructions (Addendum)
Discharge Instructions   Move your fingers as much as possible, making a full fist and fully opening the fist. Elevate your hand to reduce pain & swelling of the digits.  Ice over the operative site may be helpful to reduce pain & swelling.  DO NOT USE HEAT. Pain medicine has been prescribed for you.  Use your medicine as needed over the first 48 hours, and then you can begin to taper your use.  You may use Tylenol in place of your prescribed pain medication, but not IN ADDITION to it. Leave the dressing in place until you return to our office.  You may shower, but keep the bandage clean & dry.  You may drive a car when you are off of prescription pain medications and can safely control your vehicle with both hands.   Please call [phone] during normal business hours or [phone] after hours for any problems. Including the following:  - excessive redness of the incisions - drainage for more than 4 days - fever of more than 101.5 F  *Please note that pain medications will not be refilled after hours or on weekends.   Post Anesthesia Home Care Instructions  Activity: Get plenty of rest for the remainder of the day. A responsible adult should stay with you for 24 hours following the procedure.  For the next 24 hours, DO NOT: -Drive a car -Advertising copywriterperate machinery -Drink alcoholic beverages -Take any medication unless instructed by your physician -Make any legal decisions or sign important papers.  Meals: Start with liquid foods such as gelatin or soup. Progress to regular foods as tolerated. Avoid greasy, spicy, heavy foods. If nausea and/or vomiting occur, drink only clear liquids until the nausea and/or vomiting subsides. Call your physician if vomiting continues.  Special Instructions/Symptoms: Your throat may feel dry or sore from the anesthesia or the breathing tube placed in your throat during surgery. If this causes discomfort, gargle with warm salt water. The discomfort  should disappear within 24 hours.

## 2014-11-30 NOTE — Op Note (Signed)
11/30/2014  2:23 PM  PATIENT:  Grant Mcdonald  24 y.o. male  PRE-OPERATIVE DIAGNOSIS:  Displaced spiral fracture of right long finger proximal phalanx  POST-OPERATIVE DIAGNOSIS:  Same  PROCEDURE:  ORIF right long finger proximal phalanx fracture  SURGEON: Cliffton Asters. Janee Morn, MD  PHYSICIAN ASSISTANT: Danielle Rankin, OPA-C  ANESTHESIA:  general  SPECIMENS:  None  DRAINS:   None  EBL:  less than 50 mL  PREOPERATIVE INDICATIONS:  Grant Mcdonald is a  24 y.o. male with displaced rotated phalangeal shaft fracture of the right long finger proximal phalanx  The risks benefits and alternatives were discussed with the patient preoperatively including but not limited to the risks of infection, bleeding, nerve injury, cardiopulmonary complications, the need for revision surgery, among others, and the patient verbalized understanding and consented to proceed.  OPERATIVE IMPLANTS: Biomet hand fracture set, 1.5 mm straight plate and screws, both locking and non-locking  OPERATIVE PROCEDURE:  After receiving prophylactic antibiotics, the patient was escorted to the operative theatre and placed in a supine position.  A surgical "time-out" was performed during which the planned procedure, proposed operative site, and the correct patient identity were compared to the operative consent and agreement confirmed by the circulating nurse according to current facility policy.  Following application of a tourniquet to the operative extremity, the exposed skin was pre-scrub with Hibiclens scrub brush before being formally prepped with Chloraprep and draped in the usual sterile fashion.  The limb was exsanguinated with an Esmarch bandage and the tourniquet inflated to approximately higher than systolic BP.  Half percent Marcaine with epinephrine was instilled at the base of the digit as a digital block in the skin was incised sharply with scalpel, creating a ulnarly based flap on the dorsum over the  long finger. The flap was reflected full-thickness, extensor tendon split in the midline and reflected radially and ulnarly. The periosteum was then split slightly ulnar of the midline and reflected radially and ulnarly exposing the fracture of the proximal phalanx. It was provisionally reduced and held with a clamp before being secured by 0.045 inch K wire. A straight plate from the Biomet hand fracture set, 1.5 mm size was then selected and cut to fit the phalanx. It was applied to the dorsal radial aspect of the phalanx and then secured to the distal fragment before being subsequently fixed to the proximal fragment with another nonlocking screw. I was not completely satisfied with the alignment and so I adjusted this with the screws slightly loosened and subsequently clamped the plate to the new construct and filled the remainder of the holes proximally and distally with locking screws. The fracture appeared to be anatomically aligned reviewed directly. However, with testing for touchdown point, it appeared as if there was still a tendency for the long finger and index finger to drift away from each other slightly with flexion especially. Ultimately, it occurred to me that the index finger itself was slightly rotated due to his index metacarpal injury. Final images were obtained. Subsequently I irrigated the wound copiously and closed the periosteum over the plate with 4-0 Vicryl Rapide running sutures. 3-0 Vicryl running suture was then used to reapproximate the central tendon and the tourniquet was released. Additional hemostasis was obtained and then the skin was closed with 4-0 Vicryl Rapide interrupted sutures. A bulky hand dressing was applied and the patient was taken to room stable condition breathing spontaneously  DISPOSITION: He will be discharged home today returning on Monday to  have his splint removed, x-rays obtained of the right long finger and sent to therapy to have a splint constructed and  begin rehabilitation.

## 2014-11-30 NOTE — Anesthesia Preprocedure Evaluation (Addendum)
Anesthesia Evaluation  Patient identified by MRN, date of birth, ID band Patient awake    Reviewed: Allergy & Precautions, NPO status , Patient's Chart, lab work & pertinent test results  Airway Mallampati: I  TM Distance: >3 FB Neck ROM: Full    Dental  (+) Teeth Intact, Dental Advisory Given   Pulmonary Current Smoker,  breath sounds clear to auscultation        Cardiovascular Rhythm:Regular Rate:Normal     Neuro/Psych    GI/Hepatic   Endo/Other    Renal/GU      Musculoskeletal   Abdominal   Peds  Hematology   Anesthesia Other Findings   Reproductive/Obstetrics                             Anesthesia Physical Anesthesia Plan  ASA: I  Anesthesia Plan: General   Post-op Pain Management:    Induction: Intravenous  Airway Management Planned: LMA  Additional Equipment:   Intra-op Plan:   Post-operative Plan: Extubation in OR  Informed Consent: I have reviewed the patients History and Physical, chart, labs and discussed the procedure including the risks, benefits and alternatives for the proposed anesthesia with the patient or authorized representative who has indicated his/her understanding and acceptance.   Dental advisory given  Plan Discussed with: CRNA, Anesthesiologist and Surgeon  Anesthesia Plan Comments:         Anesthesia Quick Evaluation  

## 2014-11-30 NOTE — Anesthesia Procedure Notes (Signed)
Procedure Name: LMA Insertion Date/Time: 11/30/2014 12:49 PM Performed by: Gar GibbonKEETON, Ulice Follett S Pre-anesthesia Checklist: Patient identified, Emergency Drugs available, Suction available and Patient being monitored Patient Re-evaluated:Patient Re-evaluated prior to inductionOxygen Delivery Method: Circle System Utilized Preoxygenation: Pre-oxygenation with 100% oxygen Intubation Type: IV induction Ventilation: Mask ventilation without difficulty LMA: LMA inserted LMA Size: 5.0 Number of attempts: 1 Airway Equipment and Method: bite block Placement Confirmation: positive ETCO2 Tube secured with: Tape Dental Injury: Teeth and Oropharynx as per pre-operative assessment

## 2014-12-07 ENCOUNTER — Encounter (HOSPITAL_BASED_OUTPATIENT_CLINIC_OR_DEPARTMENT_OTHER): Payer: Self-pay | Admitting: Orthopedic Surgery

## 2014-12-30 ENCOUNTER — Encounter: Payer: Self-pay | Admitting: Internal Medicine

## 2014-12-30 ENCOUNTER — Ambulatory Visit (INDEPENDENT_AMBULATORY_CARE_PROVIDER_SITE_OTHER): Payer: BLUE CROSS/BLUE SHIELD | Admitting: Internal Medicine

## 2014-12-30 VITALS — BP 118/80 | HR 80 | Temp 97.9°F | Resp 20 | Ht 75.2 in | Wt 231.2 lb

## 2014-12-30 DIAGNOSIS — F32A Depression, unspecified: Secondary | ICD-10-CM

## 2014-12-30 DIAGNOSIS — R7989 Other specified abnormal findings of blood chemistry: Secondary | ICD-10-CM

## 2014-12-30 DIAGNOSIS — E049 Nontoxic goiter, unspecified: Secondary | ICD-10-CM

## 2014-12-30 DIAGNOSIS — R945 Abnormal results of liver function studies: Secondary | ICD-10-CM

## 2014-12-30 DIAGNOSIS — E669 Obesity, unspecified: Secondary | ICD-10-CM

## 2014-12-30 DIAGNOSIS — E119 Type 2 diabetes mellitus without complications: Secondary | ICD-10-CM

## 2014-12-30 DIAGNOSIS — F329 Major depressive disorder, single episode, unspecified: Secondary | ICD-10-CM

## 2014-12-30 DIAGNOSIS — E782 Mixed hyperlipidemia: Secondary | ICD-10-CM

## 2014-12-30 NOTE — Progress Notes (Signed)
Patient ID: Grant Mcdonald, male   DOB: 16-Feb-1991, 24 y.o.   MRN: [id number]   Location:  [facility]  Code Status: Full Code   No Known Allergies  Chief Complaint  Patient presents with  . Establish Care    HPI: Patient is a 24 y.o. white male seen in the office today to reestablish care.   He has stopped all medications and has not been checking his blood sugars. Denies fatigue, urinary frequency, increased thirst, neuropathy. Takes dog for a daily for exercise. Tries not to overload on sweets. Drinks a lot of water.   HIstory of goiter, never medicated, or biopsy. Denies tremors unless he is meditating.   Broke his finger, was infected, but finished antibiotics a few days ago.   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   No other complaints today.   History of depression. Acknowledges current feelings of depression. Notes he does have suicidal ideations at times and has a plan, would hang himself. But he wants to stay around for his dog. Does not feel suicidal today. Feels depression is cyclic. Had hallucinations and paranoid episodes so stopped Vibryd that was prescribed. Feels he manages it now with writing and meditation. He was diagnosed as a child with ADHD. He doesn't want to take medication for any psychological disorders.   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  Every now and then, gets blurry vision since childhood.  Review of Systems:  Review of Systems  Constitutional: Negative for fever, chills, weight loss and malaise/fatigue.  Eyes: Positive for blurred vision.       Blurred vision at times when he stands up quickly, has had this since he was [age] years. Does not see opthamologist.  Cardiovascular: Positive for palpitations. Negative for chest pain and orthopnea.       Palpatations especially with a lot  of coffee  Gastrointestinal: Negative for nausea, vomiting, diarrhea and constipation.  Genitourinary: Negative for dysuria, urgency, frequency and hematuria.  Musculoskeletal: Negative for myalgias and joint pain.  Neurological: Negative for dizziness, tremors and headaches.  Psychiatric/Behavioral: Positive for depression and suicidal ideas.     Past Medical History  Diagnosis Date  . Diet-controlled diabetes mellitus   . Finger fracture, right     right long finger  . Diabetes mellitus without complication     Past Surgical History  Procedure Laterality Date  . Orif metacarpal fracture Right 08/27/2012    second metacarpal  . Open reduction internal fixation (orif) distal phalanx Right 11/30/2014    Procedure: OPEN REDUCTION INTERNAL FIXATION  RIGHT LONG PHALANX FRACTURE;  Surgeon: Jodi Marbleavid A Thompson, MD;  Location: [facility];  Service: Orthopedics;  Laterality: Right;    Social History:   reports that he has been smoking E-cigarettes.  He has smoked for the past 0 years. He quit smokeless tobacco use about 13 months ago. He reports that he drinks alcohol. He reports that he does not use illicit drugs.  Family History  Problem Relation Age of Onset  . Diabetes Father   . Obesity Father   . Heart attack Father   . Diabetes Maternal Grandmother     Medications: Patient's Medications  New Prescriptions   No medications on file  Previous Medications   MULTIPLE VITAMIN (MULTIVITAMIN) TABLET    Take 1 tablet by mouth daily.  Modified Medications   No medications on file  Discontinued Medications   OXYCODONE-ACETAMINOPHEN (PERCOCET/ROXICET) 5-325 MG PER TABLET    Take by mouth every 4 (four) hours as needed for severe pain.     Physical Exam: Filed Vitals:   12/30/14 0911  BP: 118/80  Pulse: 80  Temp: 97.9 F (36.6 C)  TempSrc: Oral  Resp: 20  Height: 6' 3.2" (1.91 m)  Weight: 231 lb 3.2 oz (104.872 kg)  SpO2: 98%   Physical Exam  Constitutional:  He is oriented to person, place, and time. He appears well-developed and well-nourished.  HENT:  Head: Normocephalic.  Eyes: EOM are normal. Pupils are equal, round, and reactive to light.  Neck: Normal range of motion. Neck supple.  Slightly enlarged thryoid, no nodules  Cardiovascular: Normal rate and regular rhythm.   Pulmonary/Chest: Effort normal and breath sounds normal.  Abdominal: Soft. Bowel sounds are normal. He exhibits no distension. There is no tenderness.  Musculoskeletal: Normal range of motion.  Left third finger with scar, swelling and erythema PIP  Neurological: He is alert and oriented to person, place, and time.  Skin: Skin is warm and dry. No erythema.  Psychiatric: He has a normal mood and affect. His behavior is normal.    Labs reviewed: CBC:  Recent Labs  11/30/14 1117  HGB 16.4    Lab Results  Component Value Date   HGBA1C 5.1 06/23/2012   Assessment/Plan 1. Type 2 diabetes mellitus not at goal - has been off metformin and all meds for over a year -has not been seen for more than 2 years due to financial reasons and becoming manic after taking viibryd - Comprehensive metabolic panel - Lipid panel - Hemoglobin A1c - Microalbumin/Creatinine Ratio, Urine - CBC with Differential/Platelet  2. Goiter -f/u tsh - TSH  3. Mixed hyperlipidemia - f/u labs - Lipid panel  4. Obesity - appears he has lost weight and is doing better  5. Abnormal liver function tests -f/u liver panel  6. Depression -refuses to take any medications for this or see anyone else due to cost and previous side effects from viibryd  Labs/tests ordered:   Orders Placed This Encounter  Procedures  . Comprehensive metabolic panel    Order Specific Question:  Has the patient fasted?    Answer:  Yes  . Lipid panel    Order Specific Question:  Has the patient fasted?    Answer:  Yes  . Hemoglobin A1c  . TSH  . Microalbumin/Creatinine Ratio, Urine  . CBC with  Differential/Platelet    Next appt:   3 mos.  Kamir Selover L. Anber Mckiver, D.O. Geriatrics [facility] [address], [place] [address] Cell Phone (Mon-Fri 8am-5pm):  [phone] On Call:  [phone] & follow prompts after 5pm & weekends Office Phone:  [phone] Office Fax:  [phone]

## 2014-12-31 ENCOUNTER — Other Ambulatory Visit: Payer: Self-pay | Admitting: *Deleted

## 2014-12-31 LAB — MICROALBUMIN / CREATININE URINE RATIO
Creatinine, Urine: 91.7 mg/dL (ref 24.0–392.0)
MICROALB/CREAT RATIO: 6.1 mg/g creat (ref 0.0–30.0)
Microalbumin, Urine: 5.6 ug/mL (ref 0.0–17.0)

## 2014-12-31 LAB — CBC WITH DIFFERENTIAL/PLATELET
Basophils Absolute: 0 10*3/uL (ref 0.0–0.2)
Basos: 1 %
Eos: 4 %
Eosinophils Absolute: 0.3 10*3/uL (ref 0.0–0.4)
HCT: 47.4 % (ref 37.5–51.0)
Hemoglobin: 16.3 g/dL (ref 12.6–17.7)
Immature Grans (Abs): 0 10*3/uL (ref 0.0–0.1)
Immature Granulocytes: 0 %
Lymphocytes Absolute: 2.3 10*3/uL (ref 0.7–3.1)
Lymphs: 31 %
MCH: 30 pg (ref 26.6–33.0)
MCHC: 34.4 g/dL (ref 31.5–35.7)
MCV: 87 fL (ref 79–97)
Monocytes Absolute: 0.7 10*3/uL (ref 0.1–0.9)
Monocytes: 9 %
Neutrophils Absolute: 4.1 10*3/uL (ref 1.4–7.0)
Neutrophils Relative %: 55 %
Platelets: 281 10*3/uL (ref 150–379)
RBC: 5.43 x10E6/uL (ref 4.14–5.80)
RDW: 13.6 % (ref 12.3–15.4)
WBC: 7.4 10*3/uL (ref 3.4–10.8)

## 2014-12-31 LAB — COMPREHENSIVE METABOLIC PANEL
ALT: 84 IU/L — ABNORMAL HIGH (ref 0–44)
AST: 41 IU/L — ABNORMAL HIGH (ref 0–40)
Albumin/Globulin Ratio: 1.8 (ref 1.1–2.5)
Albumin: 4.6 g/dL (ref 3.5–5.5)
Alkaline Phosphatase: 98 IU/L (ref 39–117)
BUN/Creatinine Ratio: 14 (ref 8–19)
BUN: 11 mg/dL (ref 6–20)
Bilirubin Total: 0.3 mg/dL (ref 0.0–1.2)
CO2: 21 mmol/L (ref 18–29)
Calcium: 10 mg/dL (ref 8.7–10.2)
Chloride: 99 mmol/L (ref 97–108)
Creatinine, Ser: 0.79 mg/dL (ref 0.76–1.27)
GFR calc Af Amer: 147 mL/min/{1.73_m2} (ref >59)
GFR calc non Af Amer: 127 mL/min/{1.73_m2} (ref >59)
Globulin, Total: 2.6 g/dL (ref 1.5–4.5)
Glucose: 278 mg/dL — ABNORMAL HIGH (ref 65–99)
Potassium: 4.4 mmol/L (ref 3.5–5.2)
Sodium: 139 mmol/L (ref 134–144)
Total Protein: 7.2 g/dL (ref 6.0–8.5)

## 2014-12-31 LAB — TSH: TSH: 1.76 u[IU]/mL (ref 0.450–4.500)

## 2014-12-31 LAB — LIPID PANEL
Chol/HDL Ratio: 5.8 ratio units — ABNORMAL HIGH (ref 0.0–5.0)
Cholesterol, Total: 274 mg/dL — ABNORMAL HIGH (ref 100–199)
HDL: 47 mg/dL (ref >39)
LDL Calculated: 175 mg/dL — ABNORMAL HIGH (ref 0–99)
Triglycerides: 260 mg/dL — ABNORMAL HIGH (ref 0–149)
VLDL Cholesterol Cal: 52 mg/dL — ABNORMAL HIGH (ref 5–40)

## 2014-12-31 LAB — HEMOGLOBIN A1C
Est. average glucose Bld gHb Est-mCnc: 217 mg/dL
Hgb A1c MFr Bld: 9.2 % — ABNORMAL HIGH (ref 4.8–5.6)

## 2014-12-31 MED ORDER — METFORMIN HCL 500 MG PO TABS: 500.0000 mg | ORAL_TABLET | Freq: Every day | ORAL | Status: DC

## 2014-12-31 MED ORDER — PRAVASTATIN SODIUM 20 MG PO TABS: ORAL_TABLET | ORAL | Status: DC

## 2015-01-01 LAB — HEPATITIS PANEL, ACUTE
Hep A IgM: NEGATIVE
Hep B C IgM: NEGATIVE
Hep C Virus Ab: <0.1 s/co ratio (ref 0.0–0.9)
Hepatitis B Surface Ag: NEGATIVE

## 2015-01-01 LAB — SPECIMEN STATUS REPORT

## 2015-01-28 ENCOUNTER — Encounter: Payer: Self-pay | Admitting: Internal Medicine

## 2015-01-28 ENCOUNTER — Ambulatory Visit (INDEPENDENT_AMBULATORY_CARE_PROVIDER_SITE_OTHER): Payer: BLUE CROSS/BLUE SHIELD | Admitting: Internal Medicine

## 2015-01-28 VITALS — BP 118/58 | HR 100 | Temp 98.0°F | Resp 20 | Ht 75.0 in | Wt 224.4 lb

## 2015-01-28 DIAGNOSIS — R7989 Other specified abnormal findings of blood chemistry: Secondary | ICD-10-CM | POA: Diagnosis not present

## 2015-01-28 DIAGNOSIS — E782 Mixed hyperlipidemia: Secondary | ICD-10-CM | POA: Diagnosis not present

## 2015-01-28 DIAGNOSIS — E119 Type 2 diabetes mellitus without complications: Secondary | ICD-10-CM

## 2015-01-28 DIAGNOSIS — R945 Abnormal results of liver function studies: Secondary | ICD-10-CM

## 2015-01-28 DIAGNOSIS — L0501 Pilonidal cyst with abscess: Secondary | ICD-10-CM

## 2015-01-28 MED ORDER — METFORMIN HCL 500 MG PO TABS: 500.0000 mg | ORAL_TABLET | Freq: Two times a day (BID) | ORAL | Status: DC

## 2015-01-28 NOTE — Progress Notes (Signed)
Patient ID: Grant Mcdonald, male   DOB: 1991/03/01, 24 y.o.   MRN: [id number]   Location:  [facility]  Code Status: full code  No Known Allergies  Chief Complaint  Patient presents with  . Acute Visit    cyst x 1.5 week    HPI: Patient is a 24 y.o. white male seen in the office today for an acute visit with painful pilonidal cyst for 1.5wks at the superior aspect of his gluteal crease.  Says it started about 2 wks ago, but did not become painful until 1.5 wks.  It has not drained at all.  He had one in the past that was opened at an urgent care.    Review of Systems:  Review of Systems  Constitutional: Negative for fever and chills.  Respiratory: Negative for shortness of breath.   Cardiovascular: Negative for chest pain.  Musculoskeletal:       Very painful to sit or bend over  Skin:       Pilonidal cyst present at superior aspect of gluteal crease  Psychiatric/Behavioral: Positive for depression.       Currently under control     Past Medical History  Diagnosis Date  . Diet-controlled diabetes mellitus   . Finger fracture, right     right long finger  . Diabetes mellitus without complication     Past Surgical History  Procedure Laterality Date  . Orif metacarpal fracture Right 08/27/2012    second metacarpal  . Open reduction internal fixation (orif) distal phalanx Right 11/30/2014    Procedure: OPEN REDUCTION INTERNAL FIXATION  RIGHT LONG PHALANX FRACTURE;  Surgeon: Jodi Marble, MD;  Location: [facility];  Service: Orthopedics;  Laterality: Right;    Social History:   reports that he has been smoking E-cigarettes.  He has smoked for the past 0 years. He quit smokeless tobacco use about 14 months ago. He reports that he drinks alcohol. He reports that he does not use illicit drugs.  Family History  Problem Relation Age of Onset  . Diabetes Father   . Obesity Father   . Heart attack Father   . Diabetes  Maternal Grandmother     Medications: Patient's Medications  New Prescriptions   No medications on file  Previous Medications   METFORMIN (GLUCOPHAGE) 500 MG TABLET    Take 1 tablet (500 mg total) by mouth daily with breakfast.   MULTIPLE VITAMIN (MULTIVITAMIN) TABLET    Take 1 tablet by mouth daily.   PRAVASTATIN (PRAVACHOL) 20 MG TABLET    Take 1 tablet by mouth with evening meal.  Modified Medications   No medications on file  Discontinued Medications   No medications on file     Physical Exam: Filed Vitals:   01/28/15 0809  BP: 118/58  Pulse: 100  Temp: 98 F (36.7 C)  TempSrc: Oral  Resp: 20  Height:  (1.905 m)  Weight: 224 lb 6.4 oz (101.787 kg)  SpO2: 96%  Physical Exam  Constitutional: He appears well-developed and well-nourished.  Cardiovascular: Normal rate, regular rhythm and normal heart sounds.   Pulmonary/Chest: Effort normal.  Skin:  Small raised papules at superior aspect of gluteal crease, but surrounding this is palpable, warm, swollen area about the size of an egg, very tender, no open areas or drainage at present    Labs reviewed: Basic Metabolic Panel:  Recent Labs  78/29/56 1032  NA 139  K 4.4  CL  99  CO2 21  GLUCOSE 278*  BUN 11  CREATININE 0.79  CALCIUM 10.0  TSH 1.760   Liver Function Tests:  Recent Labs  12/30/14 1032  AST 41*  ALT 84*  ALKPHOS 98  BILITOT 0.3  PROT 7.2   No results for input(s): LIPASE, AMYLASE in the last 8760 hours. No results for input(s): AMMONIA in the last 8760 hours. CBC:  Recent Labs  11/30/14 1117 12/30/14 1032  WBC  --  7.4  NEUTROABS  --  4.1  HGB 16.4 16.3  HCT  --  47.4  MCV  --  87  PLT  --  281   Lipid Panel:  Recent Labs  12/30/14 1032  CHOL 274*  HDL 47  LDLCALC 175*  TRIG 260*  CHOLHDL 5.8*   Lab Results  Component Value Date   HGBA1C 9.2* 12/30/2014   Assessment/Plan 1. 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  Was just restarted on diabetic medications and cholesterol meds.  -will not begin abx due to afebrile and await culture during debridement for best abx selection - Ambulatory referral to General Surgery - CBC with Differential  2. Type 2 diabetes mellitus not at goal -hba1c was elevated 9.2 -metformin was restarted, but will increase to bid wc as he has tolerated it well thus far and in the past  3. Mixed hyperlipidemia -I started him on pravachol when his labs returned with LDL 175  4. Abnormal liver function tests -hepatitis panel was normal--?fatty liver--has previously been quite obese, but has lost weight  Labs/tests ordered:   Orders Placed This Encounter  Procedures  . CBC with Differential  . CBC with Differential/Platelet    Standing Status: Future     Number of Occurrences:      Standing Expiration Date: 04/01/2015  . Basic metabolic panel    Standing Status: Future     Number of Occurrences:      Standing Expiration Date: 04/01/2015    Order Specific Question:  Has the patient fasted?    Answer:  Yes  . Hepatic Function Panel    Standing Status: Future     Number of Occurrences:      Standing Expiration Date: 04/01/2015  . Hemoglobin A1c    Standing Status: Future     Number of Occurrences:      Standing Expiration Date: 04/01/2015  . Lipid panel    Standing Status: Future     Number of Occurrences:      Standing Expiration Date: 04/01/2015    Order Specific Question:  Has the patient fasted?    Answer:  Yes  . Ambulatory referral to General Surgery    Referral Priority:  Urgent    Referral Type:  Surgical    Referral Reason:  Specialty Services Required    Requested Specialty:  General Surgery    Number of Visits Requested:  1   Next appt:  Keep 5/12 appt here and  labs ordered before that  Ramin Zoll L. Taylen Wendland, D.O. Geriatrics [facility] [address] [place], [place] [address] Cell Phone (Mon-Fri 8am-5pm):  [phone] On Call:  [phone] & follow prompts after 5pm & weekends Office Phone:  [phone]  Office Fax:  [phone]

## 2015-01-29 LAB — CBC WITH DIFFERENTIAL/PLATELET
Basophils Absolute: 0 10*3/uL (ref 0.0–0.2)
Basos: 0 %
Eos: 2 %
Eosinophils Absolute: 0.2 10*3/uL (ref 0.0–0.4)
HCT: 44.1 % (ref 37.5–51.0)
Hemoglobin: 15.2 g/dL (ref 12.6–17.7)
Immature Grans (Abs): 0 10*3/uL (ref 0.0–0.1)
Immature Granulocytes: 0 %
Lymphocytes Absolute: 2.6 10*3/uL (ref 0.7–3.1)
Lymphs: 25 %
MCH: 30.3 pg (ref 26.6–33.0)
MCHC: 34.5 g/dL (ref 31.5–35.7)
MCV: 88 fL (ref 79–97)
Monocytes Absolute: 1.2 10*3/uL — ABNORMAL HIGH (ref 0.1–0.9)
Monocytes: 12 %
Neutrophils Absolute: 6.3 10*3/uL (ref 1.4–7.0)
Neutrophils Relative %: 61 %
Platelets: 307 10*3/uL (ref 150–379)
RBC: 5.01 x10E6/uL (ref 4.14–5.80)
RDW: 13.5 % (ref 12.3–15.4)
WBC: 10.4 10*3/uL (ref 3.4–10.8)

## 2015-01-30 ENCOUNTER — Encounter (HOSPITAL_COMMUNITY): Payer: Self-pay | Admitting: Nurse Practitioner

## 2015-01-30 ENCOUNTER — Emergency Department (HOSPITAL_COMMUNITY)
Admission: EM | Admit: 2015-01-30 | Discharge: 2015-01-30 | Disposition: A | Discharge status: Home / Self Care | Payer: BLUE CROSS/BLUE SHIELD | Attending: Emergency Medicine | Admitting: Emergency Medicine

## 2015-01-30 DIAGNOSIS — Z79899 Other long term (current) drug therapy: Secondary | ICD-10-CM | POA: Insufficient documentation

## 2015-01-30 DIAGNOSIS — Z720 Tobacco use: Secondary | ICD-10-CM | POA: Insufficient documentation

## 2015-01-30 DIAGNOSIS — L0501 Pilonidal cyst with abscess: Secondary | ICD-10-CM | POA: Insufficient documentation

## 2015-01-30 DIAGNOSIS — Z87828 Personal history of other (healed) physical injury and trauma: Secondary | ICD-10-CM | POA: Insufficient documentation

## 2015-01-30 DIAGNOSIS — E119 Type 2 diabetes mellitus without complications: Secondary | ICD-10-CM | POA: Insufficient documentation

## 2015-01-30 DIAGNOSIS — L0291 Cutaneous abscess, unspecified: Secondary | ICD-10-CM

## 2015-01-30 HISTORY — DX: Pilonidal cyst without abscess: L05.91

## 2015-01-30 LAB — CBC WITH DIFFERENTIAL/PLATELET
Basophils Absolute: 0 10*3/uL (ref 0.0–0.1)
Basophils Relative: 0 % (ref 0–1)
EOS ABS: 0.3 10*3/uL (ref 0.0–0.7)
Eosinophils Relative: 3 % (ref 0–5)
HCT: 44.7 % (ref 39.0–52.0)
Hemoglobin: 15.2 g/dL (ref 13.0–17.0)
LYMPHS PCT: 25 % (ref 12–46)
Lymphs Abs: 2.6 10*3/uL (ref 0.7–4.0)
MCH: 29.6 pg (ref 26.0–34.0)
MCHC: 34 g/dL (ref 30.0–36.0)
MCV: 87.1 fL (ref 78.0–100.0)
Monocytes Absolute: 1.4 10*3/uL — ABNORMAL HIGH (ref 0.1–1.0)
Monocytes Relative: 13 % — ABNORMAL HIGH (ref 3–12)
NEUTROS ABS: 6.3 10*3/uL (ref 1.7–7.7)
Neutrophils Relative %: 59 % (ref 43–77)
Platelets: 321 10*3/uL (ref 150–400)
RBC: 5.13 MIL/uL (ref 4.22–5.81)
RDW: 13.3 % (ref 11.5–15.5)
WBC: 10.6 10*3/uL — AB (ref 4.0–10.5)

## 2015-01-30 LAB — BASIC METABOLIC PANEL
ANION GAP: 9 (ref 5–15)
BUN: 7 mg/dL (ref 6–23)
CO2: 27 mmol/L (ref 19–32)
Calcium: 9.8 mg/dL (ref 8.4–10.5)
Chloride: 100 mmol/L (ref 96–112)
Creatinine, Ser: 0.78 mg/dL (ref 0.50–1.35)
GFR calc Af Amer: >90 mL/min (ref >90)
GLUCOSE: 219 mg/dL — AB (ref 70–99)
POTASSIUM: 3.7 mmol/L (ref 3.5–5.1)
SODIUM: 136 mmol/L (ref 135–145)

## 2015-01-30 LAB — URINALYSIS, DIPSTICK ONLY
Bilirubin Urine: NEGATIVE
Glucose, UA: >1000 mg/dL — AB
Hgb urine dipstick: NEGATIVE
Ketones, ur: NEGATIVE mg/dL
LEUKOCYTES UA: NEGATIVE
Nitrite: NEGATIVE
Protein, ur: NEGATIVE mg/dL
SPECIFIC GRAVITY, URINE: 1.031 — AB (ref 1.005–1.030)
UROBILINOGEN UA: 0.2 mg/dL (ref 0.0–1.0)
pH: 6 (ref 5.0–8.0)

## 2015-01-30 LAB — CBG MONITORING, ED: Glucose-Capillary: 306 mg/dL — ABNORMAL HIGH (ref 70–99)

## 2015-01-30 MED ORDER — HYDROCODONE-ACETAMINOPHEN 5-325 MG PO TABS
1.0000 | ORAL_TABLET | Freq: Once | ORAL | Status: AC
  Administered 2015-01-30: 1 via ORAL
  Filled 2015-01-30: qty 1

## 2015-01-30 MED ORDER — LIDOCAINE HCL (PF) 1 % IJ SOLN
20.0000 mL | Freq: Once | INTRAMUSCULAR | Status: AC
  Administered 2015-01-30: 20 mL via INTRADERMAL
  Filled 2015-01-30: qty 20

## 2015-01-30 MED ORDER — OXYCODONE-ACETAMINOPHEN 5-325 MG PO TABS: 2.0000 | ORAL_TABLET | ORAL | Status: DC | PRN

## 2015-01-30 NOTE — ED Notes (Addendum)
Pt was at [facility] on Friday for pilonidal cyst infection, started on oral abx. He reports the pain and swelling are getting worse despite the antibiotics. He is diabetic, glucose has been around 150 at home

## 2015-01-30 NOTE — Discharge Instructions (Signed)

## 2015-01-30 NOTE — ED Provider Notes (Signed)
CSN: [id number]     Arrival date & time 01/30/15  1532 History   First MD Initiated Contact with Patient 01/30/15 1641     Chief Complaint  Patient presents with  . Wound Infection     (Consider location/radiation/quality/duration/timing/severity/associated sxs/prior Treatment) Patient is a 24 y.o. male presenting with abscess.  Abscess Location:  Ano-genital Ano-genital abscess location:  Gluteal cleft Size:  2.5 cm Abscess quality: fluctuance and painful   Abscess quality: not draining, no induration, no redness and no warmth   Red streaking: no   Duration:  1 week Progression:  Worsening Pain details:    Quality:  Aching   Severity:  Moderate   Duration:  1 week   Timing:  Constant   Progression:  Worsening Chronicity:  Recurrent Context: diabetes   Context: not immunosuppression and not injected drug use   Relieved by:  Nothing Ineffective treatments:  Oral antibiotics Associated symptoms: no fever, no headaches, no nausea and no vomiting   Risk factors: prior abscess     Past Medical History  Diagnosis Date  . Diet-controlled diabetes mellitus   . Finger fracture, right     right long finger  . Diabetes mellitus without complication   . Pilonidal cyst    Past Surgical History  Procedure Laterality Date  . Orif metacarpal fracture Right 08/27/2012    second metacarpal  . Open reduction internal fixation (orif) distal phalanx Right 11/30/2014    Procedure: OPEN REDUCTION INTERNAL FIXATION  RIGHT LONG PHALANX FRACTURE;  Surgeon: Jodi Marble, MD;  Location: [facility];  Service: Orthopedics;  Laterality: Right;   Family History  Problem Relation Age of Onset  . Diabetes Father   . Obesity Father   . Heart attack Father   . Diabetes Maternal Grandmother    History  Substance Use Topics  . Smoking status: Current Every Day Smoker -- 0 years    Types: E-cigarettes  . Smokeless tobacco: Former Neurosurgeon    Quit date: 11/18/2013     Comment:  uses vape  . Alcohol Use: 0.0 oz/week    0 Standard drinks or equivalent per week     Comment: 1 glass wine 5 times weekly    Review of Systems  Constitutional: Negative for fever and chills.  Eyes: Negative for redness.  Respiratory: Negative for cough and shortness of breath.   Cardiovascular: Negative for chest pain.  Gastrointestinal: Negative for nausea, vomiting, abdominal pain and diarrhea.  Genitourinary: Negative for dysuria.       Pilonidal cyst  Skin: Negative for rash.  Neurological: Negative for headaches.  All other systems reviewed and are negative.     Allergies  Review of patient's allergies indicates no known allergies.  Home Medications   Prior to Admission medications   Medication Sig Start Date End Date Taking? Authorizing Provider  metFORMIN (GLUCOPHAGE) 500 MG tablet Take 1 tablet (500 mg total) by mouth 2 (two) times daily with a meal. 01/28/15   Tiffany L Reed, DO  Multiple Vitamin (MULTIVITAMIN) tablet Take 1 tablet by mouth daily.    Historical Provider, MD  oxyCODONE-acetaminophen (PERCOCET/ROXICET) 5-325 MG per tablet Take 2 tablets by mouth every 4 (four) hours as needed for severe pain. 01/30/15   Silas Flood, MD  pravastatin (PRAVACHOL) 20 MG tablet Take 1 tablet by mouth with evening meal. 12/31/14   Tiffany L Reed, DO   BP 123/76 mmHg  Pulse 60  Temp(Src) 98.3 F (36.8 C) (Oral)  Resp 16  SpO2 99% Physical Exam  Constitutional: No distress.  HENT:  Head: Normocephalic and atraumatic.  Eyes: EOM are normal. Pupils are equal, round, and reactive to light.  Neck: Normal range of motion. Neck supple.  Cardiovascular: Normal rate.   Pulmonary/Chest: Effort normal. No respiratory distress.  Abdominal: Soft. There is no tenderness.  Genitourinary:  2.5 cm pilonidal cyst that is indurated, mildly fluctuant.  Not draining.  There is no skin erythema, no warmth.  Skin: No rash noted. He is not diaphoretic.    ED Course  INCISION AND  DRAINAGE Date/Time: 01/30/2015 6:58 PM Performed by: Silas Flood Authorized by: Silas Flood Consent: Verbal consent obtained. Risks and benefits: risks, benefits and alternatives were discussed Consent given by: patient Required items: required blood products, implants, devices, and special equipment available Patient identity confirmed: verbally with patient and arm band Type: pilonidal cyst Body area: anogenital Location details: pilonidal Anesthesia: local infiltration Local anesthetic: lidocaine 1% without epinephrine Anesthetic total: 5 ml Patient sedated: no Scalpel size: 11 Incision type: single straight Complexity: simple Drainage: purulent Drainage amount: moderate Wound treatment: wound left open Packing material: 1/2 in iodoform gauze Patient tolerance: Patient tolerated the procedure well with no immediate complications   (including critical care time) Labs Review Labs Reviewed  CBC WITH DIFFERENTIAL/PLATELET - Abnormal; Notable for the following:    WBC 10.6 (*)    Monocytes Relative 13 (*)    Monocytes Absolute 1.4 (*)    All other components within normal limits  BASIC METABOLIC PANEL - Abnormal; Notable for the following:    Glucose, Bld 219 (*)    All other components within normal limits  URINALYSIS, DIPSTICK ONLY - Abnormal; Notable for the following:    Specific Gravity, Urine 1.031 (*)    Glucose, UA >1000 (*)    All other components within normal limits  CBG MONITORING, ED - Abnormal; Notable for the following:    Glucose-Capillary 306 (*)    All other components within normal limits  URINALYSIS, DIPSTICK ONLY    Imaging Review No results found.   EKG Interpretation None      MDM   Final diagnoses:  Abscess   EMERGENCY DEPARTMENT US SOFT TISSUE INTERPRETATION "Study: Limited Ultrasound of the noted body part in comments below"  INDICATIONS: Soft tissue infection Multiple views of the body part are obtained with a multi-frequency  linear probe  PERFORMED BY:  Myself  IMAGES ARCHIVED?: Yes  SIDE:Midline  BODY PART:Other soft tisse (comment in note)  Gluteal cleft  FINDINGS: Cellulitis present and Other difficult to interpret, there seems to be an area that is hypoechoic compared to surrouding tissue but that measures approx 2cm in diameter but that is still very hyperechoic w/ internal echoes  LIMITATIONS:  Body Habitus  INTERPRETATION:  Abcess present    24 y/o male w/ PMH previous pilonidal cyst comes in with recurrence of his pilonidal cyst.  Patient afebrile, HDS.  Glucose elevated but no acidosis.  Patient seen by medicine doctor as outpatient and started on bactrim but pain getting worse.  Has surgery appointment tomorrow.  Bedside u/s performed and concerning for abscess. The fluid collection was drained with moderate amount of purulent drainage.  The abscess cavity was packed with iodoform gauze packing.  The packing will be removed at the patients surgery appointment which is scheduled for tomorrow.  He will come back to the ED w/ any worsening of erythema or fevers.  He will continue his bactrim. Gave rx for percocet for pain.  I have discussed the results, Dx and Tx plan with the patient. They expressed understanding and agree with the plan and were told to return to ED with any worsening of condition or concern.    Disposition: Discharge  Condition: Good  New Prescriptions   OXYCODONE-ACETAMINOPHEN (PERCOCET/ROXICET) 5-325 MG PER TABLET    Take 2 tablets by mouth every 4 (four) hours as needed for severe pain.    [facility] [address] [id number] [place] [id number] [phone]  If symptoms worsen   Pt seen in conjunction with Dr. Meredith PelWickline     Delise Simenson, MD 01/30/15 1916  Zadie Rhineonald Wickline, MD 01/30/15 2322

## 2015-01-30 NOTE — ED Provider Notes (Signed)
Patient seen/examined in the Emergency Department in conjunction with Resident Physician Provider  Patient reports abscess Exam : pilonidal cyst noted Plan: will attempt I&D then d/c home    Zadie Rhineonald Andrewjames Weirauch, MD 01/30/15 1853

## 2015-03-31 ENCOUNTER — Ambulatory Visit: Payer: BLUE CROSS/BLUE SHIELD | Admitting: Internal Medicine

## 2015-03-31 ENCOUNTER — Encounter: Payer: Self-pay | Admitting: Internal Medicine

## 2015-03-31 DIAGNOSIS — Z0289 Encounter for other administrative examinations: Secondary | ICD-10-CM

## 2015-04-06 ENCOUNTER — Telehealth: Payer: Self-pay

## 2015-04-06 NOTE — Telephone Encounter (Signed)
Looks like he missed his labs and his last appt. Is he planning to keep coming? He needs someone to manage his diabetes.          ----- Message -----     From: SYSTEM     Sent: 04/06/2015 12:04 AM      To: Kermit Baloiffany L Reed, DO    Left message on voicemail for patient to return call when available

## 2015-04-26 ENCOUNTER — Other Ambulatory Visit: Payer: Self-pay | Admitting: Internal Medicine

## 2015-04-29 NOTE — Telephone Encounter (Signed)
Left message on voicemail for patient to return call when available   

## 2015-07-19 NOTE — Telephone Encounter (Signed)
Left message on voicemail for patient to return call when available    Appointment Letter mailed

## 2015-12-18 IMAGING — RF DG FINGER MIDDLE 2+V*R*
1 series · 3 of 3 positions shown · non-contrast
Comparison: None.

CLINICAL DATA: ORIF.

EXAM:
DG C-ARM 61-120 MIN; RIGHT MIDDLE FINGER 2+V

[Series 1: run · 3 of 3 slices shown]
[im 1/3]
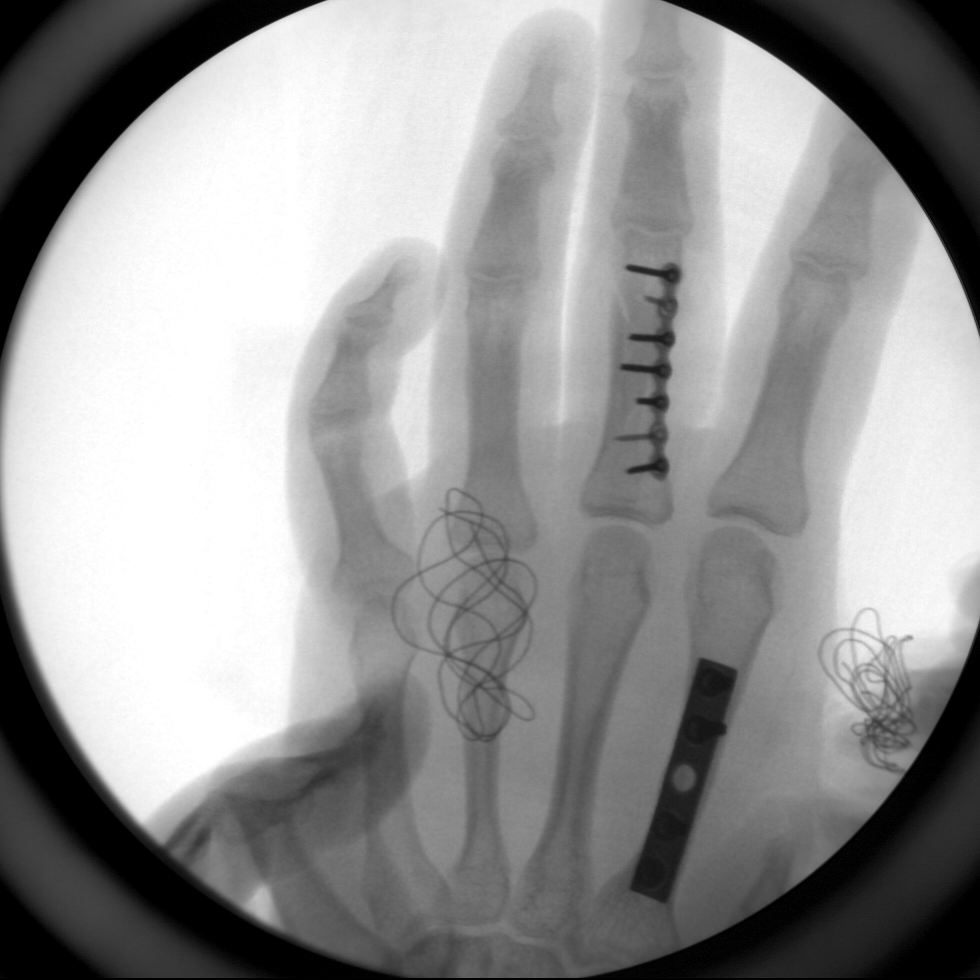
[im 2/3]
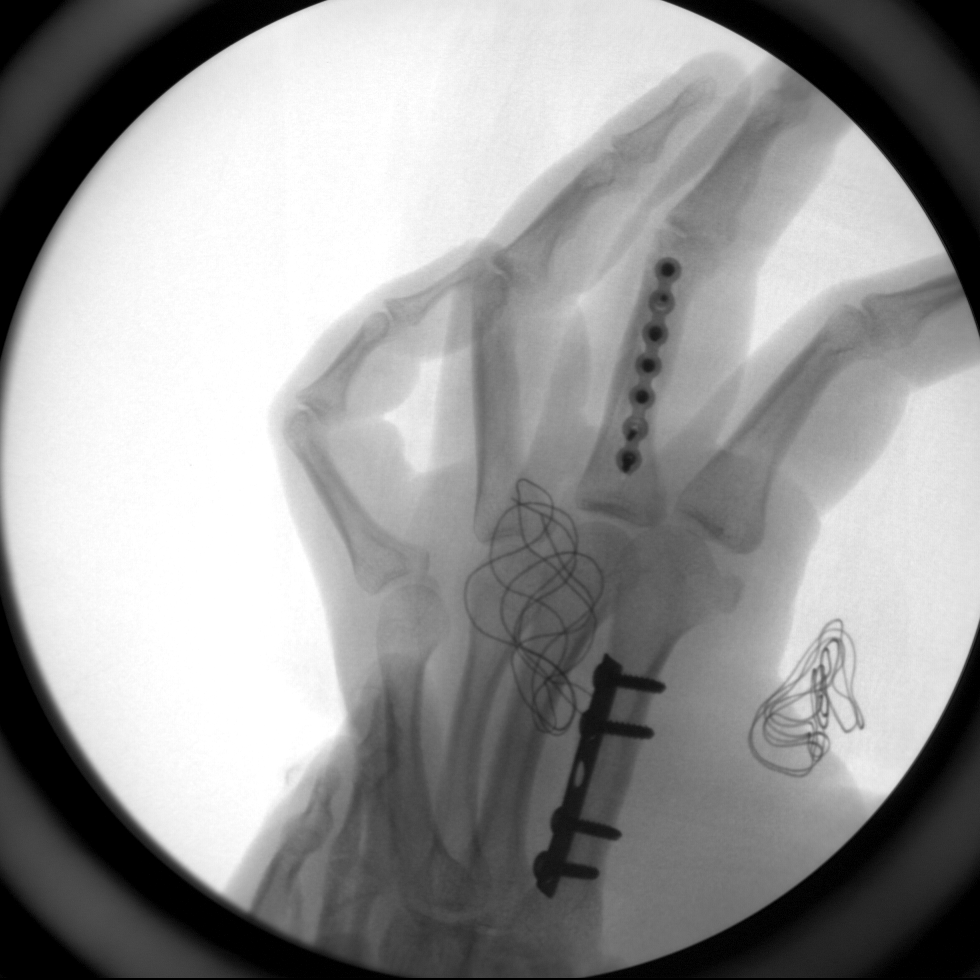
[im 3/3]
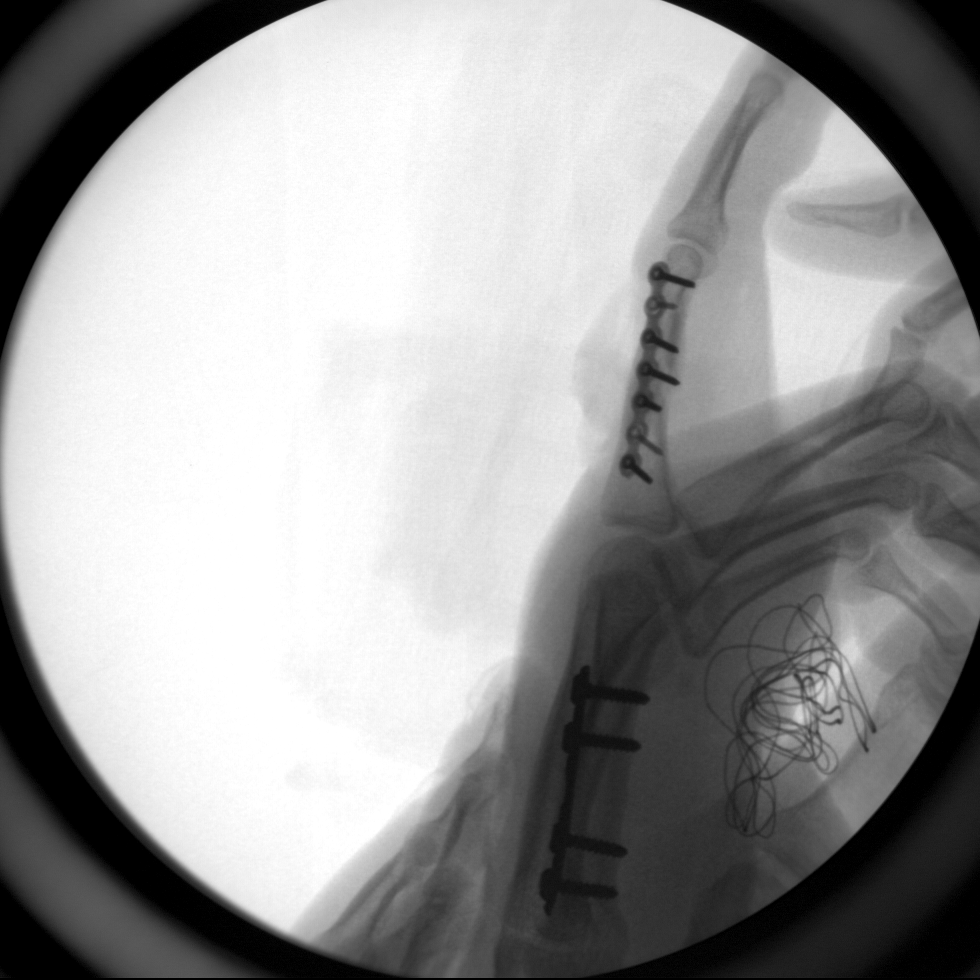

[3 of 3 positions shown; findings below may reference images not displayed]

FINDINGS: Patient has had prior plate and screw fixation of the proximal third
phalanx and the second metacarpal. Good anatomic alignment.
IMPRESSION: Good anatomic alignment post ORIF.

## 2018-02-03 ENCOUNTER — Telehealth: Payer: Self-pay | Admitting: *Deleted

## 2018-02-03 NOTE — Telephone Encounter (Signed)
Spoke with grandmother and advised results, she will try and find him another provider.

## 2018-02-03 NOTE — Telephone Encounter (Signed)
-----   Message from Kermit Baloiffany L Reed, DO sent at 02/03/2018 11:46 AM EDT ----- Please notify [name] that he should seek care with regular internal medicine. I specialize in geriatrics.  He also was noncompliant with his appts and medications when he was a patient.    ----- Message ----- From: Maurice SmallBeatty, Shueneaka C, CMA Sent: 02/03/2018   8:17 AM To: Kermit Baloiffany L Reed, DO, Sueanne Margaritaeshannon L Ethen Bannan, CMA  Patient would like to be seen (abcess on back) , last appointment 3 years ago, patient would be considered a new patient. I offered appointment with Shanda BumpsJessica and patient's grandmother Rich Reining(Ruth Ellegood) refused and stated patient needs to re-establish with you. Ms.Klingler asked that I ask you if you would be willing to take patient on for he seen you in the past. I informed Ms.Grant Mcdonald that your next available is 02/10/18 and patient would have to be seen at Urgent Care in the mean time.   Hester Matesute Wease [phone]

## 2018-02-03 NOTE — Telephone Encounter (Signed)
Phone just rang no answer no answering machine, will call back later.

## 2018-02-06 ENCOUNTER — Encounter (HOSPITAL_COMMUNITY): Payer: Self-pay | Admitting: Emergency Medicine

## 2018-02-06 ENCOUNTER — Other Ambulatory Visit: Payer: Self-pay

## 2018-02-06 ENCOUNTER — Ambulatory Visit (HOSPITAL_COMMUNITY)
Admission: EM | Admit: 2018-02-06 | Discharge: 2018-02-06 | Disposition: A | Discharge status: Home / Self Care | Payer: BLUE CROSS/BLUE SHIELD | Attending: Family Medicine | Admitting: Family Medicine

## 2018-02-06 DIAGNOSIS — L0591 Pilonidal cyst without abscess: Secondary | ICD-10-CM | POA: Diagnosis not present

## 2018-02-06 MED ORDER — SULFAMETHOXAZOLE-TRIMETHOPRIM 800-160 MG PO TABS: 1.0000 | ORAL_TABLET | Freq: Two times a day (BID) | ORAL | 0 refills | Status: AC

## 2018-02-06 NOTE — ED Provider Notes (Signed)
[facility]   [id number] 02/06/18 Arrival Time: 1142  ASSESSMENT & PLAN:  1. Pilonidal cyst   - chronic, with drainage  Meds ordered this encounter  Medications  . sulfamethoxazole-trimethoprim (BACTRIM DS,SEPTRA DS) 800-160 MG tablet    Sig: Take 1 tablet by mouth 2 (two) times daily for 10 days.    Dispense:  20 tablet    Refill:  0   Plans to schedule f/u with general surgeon for evaluation and treatment options. May f/u here as needed.  Will follow up with PCP or here if worsening or failing to improve as anticipated. Reviewed expectations re: course of current medical issues. Questions answered. Outlined signs and symptoms indicating need for more acute intervention. Patient verbalized understanding. After Visit Summary given.   SUBJECTIVE:  Grant Mcdonald is a 27 y.o. male who presents with a skin complaint. Chronic pilonidal cyst with persistent drainage. No current discomfort. Afebrile. Present for approximately 4 years. No specific aggravating or alleviating factors reported. Occasional I&D needed. No self/home treatment. Does not limit him.  ROS: As per HPI.  OBJECTIVE: Vitals:   02/06/18 1228  BP: 113/64  Pulse: 91  Temp: 98.2 F (36.8 C)  TempSrc: Oral  SpO2: 98%    General appearance: alert; no distress Lungs: clear to auscultation bilaterally Heart: regular rate and rhythm Extremities: no edema Skin: warm and dry; chronic inflammatory skin changes at superior gluteal cleft with pilonidal cyst; small amount of clear/white drainage; mild tenderness; no fluctuance Psychological: alert and cooperative; normal mood and affect  No Known Allergies  Past Medical History:  Diagnosis Date  . Diabetes mellitus without complication (HCC)   . Diet-controlled diabetes mellitus (HCC)   . Finger fracture, right    right long finger  . Pilonidal cyst    Social History   Socioeconomic History  . Marital status: Single    Spouse name: Not on file   . Number of children: Not on file  . Years of education: Not on file  . Highest education level: Not on file  Occupational History  . Not on file  Social Needs  . Financial resource strain: Not on file  . Food insecurity:    Worry: Not on file    Inability: Not on file  . Transportation needs:    Medical: Not on file    Non-medical: Not on file  Tobacco Use  . Smoking status: Current Every Day Smoker    Years: 0.00    Types: E-cigarettes  . Smokeless tobacco: Former NeurosurgeonUser    Quit date: 11/18/2013  . Tobacco comment: uses vape  Substance and Sexual Activity  . Alcohol use: Yes    Alcohol/week: 0.0 oz    Comment: 1 glass wine 5 times weekly  . Drug use: No  . Sexual activity: Not on file  Lifestyle  . Physical activity:    Days per week: Not on file    Minutes per session: Not on file  . Stress: Not on file  Relationships  . Social connections:    Talks on phone: Not on file    Gets together: Not on file    Attends religious service: Not on file    Active member of club or organization: Not on file    Attends meetings of clubs or organizations: Not on file    Relationship status: Not on file  . Intimate partner violence:    Fear of current or ex partner: Not on file    Emotionally abused:  Not on file    Physically abused: Not on file    Forced sexual activity: Not on file  Other Topics Concern  . Not on file  Social History Narrative   Diet- No   Caffeine- Yes   Married- Single   House- Yes, 1 story with 2 people   Pets- Frog   Current/Past profession- CNA   Exercise- Yes ( walking 2 times a week)   Living Will- No   DNR- No   POA/HPOA- No      Family History  Problem Relation Age of Onset  . Diabetes Father   . Obesity Father   . Heart attack Father   . Diabetes Maternal Grandmother    Past Surgical History:  Procedure Laterality Date  . OPEN REDUCTION INTERNAL FIXATION (ORIF) DISTAL PHALANX Right 11/30/2014   Procedure: OPEN REDUCTION INTERNAL  FIXATION  RIGHT LONG PHALANX FRACTURE;  Surgeon: Jodi Marble, MD;  Location: [facility];  Service: Orthopedics;  Laterality: Right;  . ORIF METACARPAL FRACTURE Right 08/27/2012   second metacarpal     Mardella Layman, MD 02/11/18 [phone]

## 2018-02-06 NOTE — ED Triage Notes (Signed)
Pt reports a cyst near his tailbone that he has had for 4 years that has been lanced several times.  He states it is draining now and needs to be checked.  He is looking for a referral for surgery to have it permanently removed.

## 2018-06-19 ENCOUNTER — Ambulatory Visit: Payer: Self-pay | Admitting: Surgery

## 2018-06-19 NOTE — H&P (Signed)
Grant HarbourMichael D Mcdonald Documented: 06/19/2018 9:46 AM Location: [facility] Patient #: [id number] DOB: [birth year] Single / Language: Lenox PondsEnglish / Race: White Male  History of Present Illness (Aideliz Garmany A. Fredricka Bonineonnor MD; 06/19/2018 10:01 AM) Patient words: Follow-up for pilonidal cyst. It is draining off and on for the last 2 years, and he has had had it lanced 3 times most recently in our office last week. He is otherwise relatively healthy. He works in [employer].  The patient is a 27 year old male.   Medication History (Armen Emelda FearFerguson, CMA; 06/19/2018 9:47 AM) Multivitamin Adult (Oral) Active. Medications Reconciled     Review of Systems (Nyra Anspaugh A. Fredricka Bonineonnor MD; 06/19/2018 10:01 AM) All other systems negative  Vitals (Armen Ferguson CMA; 06/19/2018 9:47 AM) 06/19/2018 9:47 AM Weight: 197.13 lb Height: 76in Body Surface Area: 2.2 m Body Mass Index: 23.99 kg/m  Temp.: 98.27F  Pulse: 110 (Regular)  P.OX: 98% (Room air) BP: 124/72 (Sitting, Left Arm, Standard)      Physical Exam (Draeden Kellman A. Fredricka Bonineonnor MD; 06/19/2018 10:01 AM)  The physical exam findings are as follows: Note:Gen: alert and well appearing Eye: extraocular motion intact, no scleral icterus ENT: moist mucus membranes, dentition intact Neck: no mass or thyromegaly Chest: unlabored respirations, symmetrical air entry, clear bilaterally CV: regular rate and rhythm, no pedal edema Abdomen: soft, nontender, nondistended. No mass or organomegaly MSK: strength symmetrical throughout, no deformity Neuro: grossly intact, normal gait Psych: normal mood and affect, appropriate insight Skin: warm and dry. There is a open stab incision just to the right of midline in the superior natal cleft. There is a sinus tract draining in the midline several centimeters inferior to this.    Assessment & Plan (Milano Rosevear A. Fredricka Bonineonnor MD; 06/19/2018 10:02 AM)  PILONIDAL SINUS WITH ABSCESS (L05.02) Story: We discussed options including  hair removal, ongoing supportive care, cyst excision with primary closure versus healing by secondary intention, marsupialization of the cyst, excision with flap coverage. I recommended marsupialization and advised that this will necessitate an open wound that will take several weeks to heal and required once or twice daily dressing changes at home. He would like to pursue this option. We will schedule at his convenience.

## 2019-05-12 ENCOUNTER — Ambulatory Visit: Payer: Self-pay | Admitting: Surgery

## 2019-05-12 NOTE — H&P (Signed)
Surgical H&P  CC: Pilonidal cyst  HPI: This is a very nice 28 year old man who I first saw nearly a year ago, he has got about a 4 year history of a pilonidal cyst which has been draining on and off, and has required several I&D's.  He is a relatively otherwise healthy.  He works in [employer].  We had planned to go forward with cyst marsupialization when I saw him back in August 2019, for financial reasons he has deferred surgery until now.  He is back and ready to proceed with scheduling. No changes in his health otherwise   No Known Allergies  Past Medical History:  Diagnosis Date  . Diabetes mellitus without complication (HCC)   . Diet-controlled diabetes mellitus (HCC)   . Finger fracture, right    right long finger  . Pilonidal cyst     Past Surgical History:  Procedure Laterality Date  . OPEN REDUCTION INTERNAL FIXATION (ORIF) DISTAL PHALANX Right 11/30/2014   Procedure: OPEN REDUCTION INTERNAL FIXATION  RIGHT LONG PHALANX FRACTURE;  Surgeon: Jodi Marbleavid A Thompson, MD;  Location: [facility];  Service: Orthopedics;  Laterality: Right;  . ORIF METACARPAL FRACTURE Right 08/27/2012   second metacarpal    Family History  Problem Relation Age of Onset  . Diabetes Father   . Obesity Father   . Heart attack Father   . Diabetes Maternal Grandmother     Social History   Socioeconomic History  . Marital status: Single    Spouse name: Not on file  . Number of children: Not on file  . Years of education: Not on file  . Highest education level: Not on file  Occupational History  . Not on file  Social Needs  . Financial resource strain: Not on file  . Food insecurity    Worry: Not on file    Inability: Not on file  . Transportation needs    Medical: Not on file    Non-medical: Not on file  Tobacco Use  . Smoking status: Current Every Day Smoker    Years: 0.00    Types: E-cigarettes  . Smokeless tobacco: Former NeurosurgeonUser    Quit date: 11/18/2013  . Tobacco  comment: uses vape  Substance and Sexual Activity  . Alcohol use: Yes    Alcohol/week: 0.0 standard drinks    Comment: 1 glass wine 5 times weekly  . Drug use: No  . Sexual activity: Not on file  Lifestyle  . Physical activity    Days per week: Not on file    Minutes per session: Not on file  . Stress: Not on file  Relationships  . Social Musicianconnections    Talks on phone: Not on file    Gets together: Not on file    Attends religious service: Not on file    Active member of club or organization: Not on file    Attends meetings of clubs or organizations: Not on file    Relationship status: Not on file  Other Topics Concern  . Not on file  Social History Narrative   Diet- No   Caffeine- Yes   Married- Single   House- Yes, 1 story with 2 people   Pets- Frog   Current/Past profession- CNA   Exercise- Yes ( walking 2 times a week)   Living Will- No   DNR- No   POA/HPOA- No       Current Outpatient Medications on File Prior to Visit  Medication Sig Dispense Refill  .  metFORMIN (GLUCOPHAGE) 500 MG tablet Take 1 tablet (500 mg total) by mouth 2 (two) times daily with a meal. 60 tablet 3  . metFORMIN (GLUCOPHAGE) 500 MG tablet TAKE 1 TABLET BY MOUTH EVERY DAY WITH BREAKFAST 30 tablet 3  . Multiple Vitamin (MULTIVITAMIN) tablet Take 1 tablet by mouth daily.    . pravastatin (PRAVACHOL) 20 MG tablet Take 1 tablet by mouth with evening meal. 30 tablet 3   No current facility-administered medications on file prior to visit.     Review of Systems: a complete, 10pt review of systems was completed with pertinent positives and negatives as documented in the HPI  Physical Exam: There were no vitals filed for this visit. Gen: alert and well appearing Eye: extraocular motion intact, no scleral icterus ENT: moist mucus membranes, dentition intact Neck: no mass or thyromegaly Chest: unlabored respirations, symmetrical air entry, clear bilaterally CV: regular rate and rhythm, no pedal  edema Abdomen: soft, nontender, nondistended. No mass or organomegaly MSK: strength symmetrical throughout, no deformity Neuro: grossly intact, normal gait Psych: normal mood and affect, appropriate insight Skin: warm and dry, at the superior aspect of the natal cleft there is a chronic cyst about 6 cm in length, chronically draining fibrinopurulent material through an opening on the lower aspect.  2 small midline pits inferiorly.   CBC Latest Ref Rng & Units 01/30/2015 01/28/2015 12/30/2014  WBC 4.0 - 10.5 K/uL 10.6(H) 10.4 7.4  Hemoglobin 13.0 - 17.0 g/dL 15.2 15.2 16.3  Hematocrit 39.0 - 52.0 % 44.7 44.1 47.4  Platelets 150 - 400 K/uL 321 307 281    CMP Latest Ref Rng & Units 01/30/2015 12/30/2014 08/27/2012  Glucose 70 - 99 mg/dL 219(H) 278(H) 109(H)  BUN 6 - 23 mg/dL 7 11 9   Creatinine 0.50 - 1.35 mg/dL 0.78 0.79 0.90  Sodium 135 - 145 mmol/L 136 139 143  Potassium 3.5 - 5.1 mmol/L 3.7 4.4 3.9  Chloride 96 - 112 mmol/L 100 99 107  CO2 19 - 32 mmol/L 27 21 -  Calcium 8.4 - 10.5 mg/dL 9.8 10.0 -  Total Protein 6.0 - 8.5 g/dL - 7.2 -  Total Bilirubin 0.0 - 1.2 mg/dL - 0.3 -  Alkaline Phos 39 - 117 IU/L - 98 -  AST 0 - 40 IU/L - 41(H) -  ALT 0 - 44 IU/L - 84(H) -    No results found for: INR, PROTIME  Imaging: No results found.   A/P: PILONIDAL SINUS WITH ABSCESS (L05.02) Story: We discussed options including hair removal, ongoing supportive care, cyst excision with primary closure versus healing by secondary intention, marsupialization of the cyst, excision with flap coverage. I recommended marsupialization and advised that this will necessitate an open wound that will take several weeks to heal and required once or twice daily dressing changes at home. He would like to pursue this option. We will schedule at his [place], [facility], [place] Pager [phone]

## 2019-06-05 ENCOUNTER — Encounter (HOSPITAL_BASED_OUTPATIENT_CLINIC_OR_DEPARTMENT_OTHER): Payer: Self-pay | Admitting: *Deleted

## 2019-06-05 NOTE — Progress Notes (Signed)
Ensure pre surgery drink given with instructions to complete by 1000 dos, pt verbalized understanding. 

## 2019-06-09 ENCOUNTER — Other Ambulatory Visit (HOSPITAL_COMMUNITY)
Admission: RE | Admit: 2019-06-09 | Discharge: 2019-06-09 | Disposition: A | Discharge status: Home / Self Care | Payer: BC Managed Care – PPO | Source: Ambulatory Visit | Attending: Surgery | Admitting: Surgery

## 2019-06-09 DIAGNOSIS — Z1159 Encounter for screening for other viral diseases: Secondary | ICD-10-CM | POA: Diagnosis present

## 2019-06-09 LAB — SARS CORONAVIRUS 2 (TAT 6-24 HRS): SARS Coronavirus 2: NEGATIVE

## 2019-06-12 ENCOUNTER — Ambulatory Visit (HOSPITAL_BASED_OUTPATIENT_CLINIC_OR_DEPARTMENT_OTHER): Payer: BC Managed Care – PPO | Admitting: Anesthesiology

## 2019-06-12 ENCOUNTER — Encounter (HOSPITAL_BASED_OUTPATIENT_CLINIC_OR_DEPARTMENT_OTHER)
Admission: RE | Disposition: A | Discharge status: Home / Self Care | Payer: Self-pay | Source: Home / Self Care | Attending: Surgery

## 2019-06-12 ENCOUNTER — Other Ambulatory Visit: Payer: Self-pay

## 2019-06-12 ENCOUNTER — Ambulatory Visit (HOSPITAL_BASED_OUTPATIENT_CLINIC_OR_DEPARTMENT_OTHER)
Admission: RE | Admit: 2019-06-12 | Discharge: 2019-06-12 | Disposition: A | Discharge status: Home / Self Care | Payer: BC Managed Care – PPO | Attending: Surgery | Admitting: Surgery

## 2019-06-12 ENCOUNTER — Encounter (HOSPITAL_BASED_OUTPATIENT_CLINIC_OR_DEPARTMENT_OTHER): Payer: Self-pay

## 2019-06-12 DIAGNOSIS — L0591 Pilonidal cyst without abscess: Secondary | ICD-10-CM | POA: Insufficient documentation

## 2019-06-12 DIAGNOSIS — F17290 Nicotine dependence, other tobacco product, uncomplicated: Secondary | ICD-10-CM | POA: Insufficient documentation

## 2019-06-12 DIAGNOSIS — E119 Type 2 diabetes mellitus without complications: Secondary | ICD-10-CM | POA: Diagnosis not present

## 2019-06-12 DIAGNOSIS — Z7984 Long term (current) use of oral hypoglycemic drugs: Secondary | ICD-10-CM | POA: Diagnosis not present

## 2019-06-12 HISTORY — PX: PILONIDAL CYST EXCISION: SHX744

## 2019-06-12 HISTORY — DX: Nausea with vomiting, unspecified: Z98.890

## 2019-06-12 HISTORY — DX: Nausea with vomiting, unspecified: R11.2

## 2019-06-12 SURGERY — EXCISION, SIMPLE PILONIDAL CYST
Anesthesia: Monitor Anesthesia Care | Site: Back

## 2019-06-12 MED ORDER — FENTANYL CITRATE (PF) 100 MCG/2ML IJ SOLN
INTRAMUSCULAR | Status: DC | PRN
  Administered 2019-06-12: 100 ug via INTRAVENOUS
  Administered 2019-06-12: 25 ug via INTRAVENOUS

## 2019-06-12 MED ORDER — MIDAZOLAM HCL 2 MG/2ML IJ SOLN
INTRAMUSCULAR | Status: AC
  Filled 2019-06-12: qty 2

## 2019-06-12 MED ORDER — FENTANYL CITRATE (PF) 100 MCG/2ML IJ SOLN
INTRAMUSCULAR | Status: AC
  Filled 2019-06-12: qty 2

## 2019-06-12 MED ORDER — GABAPENTIN 300 MG PO CAPS
300.0000 mg | ORAL_CAPSULE | ORAL | Status: AC
  Administered 2019-06-12: 12:00:00 300 mg via ORAL

## 2019-06-12 MED ORDER — CEFAZOLIN SODIUM-DEXTROSE 2-4 GM/100ML-% IV SOLN: 2.0000 g | INTRAVENOUS | Status: DC

## 2019-06-12 MED ORDER — HYDROCODONE-ACETAMINOPHEN 5-325 MG PO TABS: 1.0000 | ORAL_TABLET | Freq: Four times a day (QID) | ORAL | 0 refills | Status: AC | PRN

## 2019-06-12 MED ORDER — BUPIVACAINE LIPOSOME 1.3 % IJ SUSP
INTRAMUSCULAR | Status: DC | PRN
  Administered 2019-06-12: 20 mL

## 2019-06-12 MED ORDER — CHLORHEXIDINE GLUCONATE 4 % EX LIQD: 60.0000 mL | Freq: Once | CUTANEOUS | Status: DC

## 2019-06-12 MED ORDER — SUCCINYLCHOLINE CHLORIDE 20 MG/ML IJ SOLN
INTRAMUSCULAR | Status: DC | PRN
  Administered 2019-06-12: 120 mg via INTRAVENOUS

## 2019-06-12 MED ORDER — BUPIVACAINE LIPOSOME 1.3 % IJ SUSP: 20.0000 mL | Freq: Once | INTRAMUSCULAR | Status: DC

## 2019-06-12 MED ORDER — ACETAMINOPHEN 500 MG PO TABS
1000.0000 mg | ORAL_TABLET | ORAL | Status: AC
  Administered 2019-06-12: 1000 mg via ORAL

## 2019-06-12 MED ORDER — CELECOXIB 200 MG PO CAPS
200.0000 mg | ORAL_CAPSULE | ORAL | Status: AC
  Administered 2019-06-12: 12:00:00 200 mg via ORAL

## 2019-06-12 MED ORDER — SCOPOLAMINE 1 MG/3DAYS TD PT72: 1.0000 | MEDICATED_PATCH | Freq: Once | TRANSDERMAL | Status: DC

## 2019-06-12 MED ORDER — SUGAMMADEX SODIUM 500 MG/5ML IV SOLN
INTRAVENOUS | Status: AC
  Filled 2019-06-12: qty 5

## 2019-06-12 MED ORDER — DOCUSATE SODIUM 100 MG PO CAPS: 100.0000 mg | ORAL_CAPSULE | Freq: Two times a day (BID) | ORAL | 0 refills | Status: AC

## 2019-06-12 MED ORDER — DEXAMETHASONE SODIUM PHOSPHATE 4 MG/ML IJ SOLN
INTRAMUSCULAR | Status: DC | PRN
  Administered 2019-06-12: 5 mg via INTRAVENOUS

## 2019-06-12 MED ORDER — BUPIVACAINE-EPINEPHRINE 0.5% -1:200000 IJ SOLN
INTRAMUSCULAR | Status: DC | PRN
  Administered 2019-06-12: 30 mL

## 2019-06-12 MED ORDER — ROCURONIUM BROMIDE 10 MG/ML (PF) SYRINGE
PREFILLED_SYRINGE | INTRAVENOUS | Status: AC
  Filled 2019-06-12: qty 10

## 2019-06-12 MED ORDER — LIDOCAINE HCL (CARDIAC) PF 100 MG/5ML IV SOSY
PREFILLED_SYRINGE | INTRAVENOUS | Status: DC | PRN
  Administered 2019-06-12: 80 mg via INTRAVENOUS

## 2019-06-12 MED ORDER — SUCCINYLCHOLINE CHLORIDE 200 MG/10ML IV SOSY
PREFILLED_SYRINGE | INTRAVENOUS | Status: AC
  Filled 2019-06-12: qty 20

## 2019-06-12 MED ORDER — MIDAZOLAM HCL 5 MG/5ML IJ SOLN
INTRAMUSCULAR | Status: DC | PRN
  Administered 2019-06-12: 2 mg via INTRAVENOUS

## 2019-06-12 MED ORDER — PROPOFOL 10 MG/ML IV BOLUS
INTRAVENOUS | Status: DC | PRN
  Administered 2019-06-12: 200 mg via INTRAVENOUS

## 2019-06-12 MED ORDER — PROMETHAZINE HCL 25 MG/ML IJ SOLN: 6.2500 mg | INTRAMUSCULAR | Status: DC | PRN

## 2019-06-12 MED ORDER — LACTATED RINGERS IV SOLN
INTRAVENOUS | Status: DC
  Administered 2019-06-12 (×2): via INTRAVENOUS

## 2019-06-12 MED ORDER — KETOROLAC TROMETHAMINE 30 MG/ML IJ SOLN: 30.0000 mg | Freq: Once | INTRAMUSCULAR | Status: DC | PRN

## 2019-06-12 MED ORDER — ONDANSETRON HCL 4 MG/2ML IJ SOLN
INTRAMUSCULAR | Status: DC | PRN
  Administered 2019-06-12 (×2): 4 mg via INTRAVENOUS

## 2019-06-12 MED ORDER — FENTANYL CITRATE (PF) 100 MCG/2ML IJ SOLN: 25.0000 ug | INTRAMUSCULAR | Status: DC | PRN

## 2019-06-12 MED ORDER — CELECOXIB 200 MG PO CAPS
ORAL_CAPSULE | ORAL | Status: AC
  Filled 2019-06-12: qty 1

## 2019-06-12 MED ORDER — ACETAMINOPHEN 500 MG PO TABS
ORAL_TABLET | ORAL | Status: AC
  Filled 2019-06-12: qty 2

## 2019-06-12 MED ORDER — CEFAZOLIN SODIUM-DEXTROSE 2-4 GM/100ML-% IV SOLN
INTRAVENOUS | Status: AC
  Filled 2019-06-12: qty 100

## 2019-06-12 MED ORDER — GABAPENTIN 300 MG PO CAPS
ORAL_CAPSULE | ORAL | Status: AC
  Filled 2019-06-12: qty 1

## 2019-06-12 MED ORDER — BUPIVACAINE LIPOSOME 1.3 % IJ SUSP
INTRAMUSCULAR | Status: AC
  Filled 2019-06-12: qty 20

## 2019-06-12 SURGICAL SUPPLY — 48 items
APL SKNCLS STERI-STRIP NONHPOA (GAUZE/BANDAGES/DRESSINGS) ×1
BENZOIN TINCTURE PRP APPL 2/3 (GAUZE/BANDAGES/DRESSINGS) ×2 IMPLANT
BLADE CLIPPER SURG (BLADE) ×2 IMPLANT
BLADE SURG 15 STRL LF DISP TIS (BLADE) ×1 IMPLANT
BLADE SURG 15 STRL SS (BLADE) ×2
CANISTER SUCT 1200ML W/VALVE (MISCELLANEOUS) ×1 IMPLANT
COVER BACK TABLE REUSABLE LG (DRAPES) ×2 IMPLANT
COVER MAYO STAND REUSABLE (DRAPES) ×2 IMPLANT
COVER WAND RF STERILE (DRAPES) IMPLANT
DECANTER SPIKE VIAL GLASS SM (MISCELLANEOUS) IMPLANT
DRAIN CHANNEL 10F 3/8 F FF (DRAIN) IMPLANT
DRAIN PENROSE 1/4X12 LTX STRL (WOUND CARE) IMPLANT
DRAPE LAPAROTOMY T 102X78X121 (DRAPES) ×2 IMPLANT
DRAPE UTILITY XL STRL (DRAPES) ×2 IMPLANT
DRSG PAD ABDOMINAL 8X10 ST (GAUZE/BANDAGES/DRESSINGS) ×1 IMPLANT
ELECT COATED BLADE 2.86 ST (ELECTRODE) ×2 IMPLANT
ELECT REM PT RETURN 9FT ADLT (ELECTROSURGICAL) ×2
ELECTRODE REM PT RTRN 9FT ADLT (ELECTROSURGICAL) ×1 IMPLANT
EVACUATOR SILICONE 100CC (DRAIN) IMPLANT
GAUZE 4X4 16PLY RFD (DISPOSABLE) IMPLANT
GAUZE SPONGE 4X4 12PLY STRL LF (GAUZE/BANDAGES/DRESSINGS) ×2 IMPLANT
GLOVE BIO SURGEON STRL SZ 6 (GLOVE) ×2 IMPLANT
GOWN STRL REUS W/ TWL LRG LVL3 (GOWN DISPOSABLE) ×2 IMPLANT
GOWN STRL REUS W/TWL LRG LVL3 (GOWN DISPOSABLE) ×4
NS IRRIG 1000ML POUR BTL (IV SOLUTION) ×2 IMPLANT
PACK BASIN DAY SURGERY FS (CUSTOM PROCEDURE TRAY) ×2 IMPLANT
PENCIL BUTTON HOLSTER BLD 10FT (ELECTRODE) ×2 IMPLANT
SLEEVE SCD COMPRESS KNEE MED (MISCELLANEOUS) ×2 IMPLANT
SUCTION FRAZIER HANDLE 10FR (MISCELLANEOUS) ×1
SUCTION TUBE FRAZIER 10FR DISP (MISCELLANEOUS) IMPLANT
SUT CHROMIC 3 0 SH 27 (SUTURE) IMPLANT
SUT ETHILON 2 0 FS 18 (SUTURE) IMPLANT
SUT ETHILON 3 0 FSL (SUTURE) IMPLANT
SUT ETHILON 4 0 PS 2 18 (SUTURE) IMPLANT
SUT MNCRL AB 3-0 PS2 18 (SUTURE) IMPLANT
SUT MON AB 2-0 CT1 36 (SUTURE) IMPLANT
SUT VIC AB 2-0 CT1 27 (SUTURE)
SUT VIC AB 2-0 CT1 TAPERPNT 27 (SUTURE) IMPLANT
SUT VIC AB 3-0 CT1 27 (SUTURE)
SUT VIC AB 3-0 CT1 27XBRD (SUTURE) IMPLANT
SUT VIC AB 4-0 SH 18 (SUTURE) IMPLANT
SUT VICRYL 4-0 PS2 18IN ABS (SUTURE) ×1 IMPLANT
SWAB COLLECTION DEVICE MRSA (MISCELLANEOUS) IMPLANT
SYR CONTROL 10ML LL (SYRINGE) IMPLANT
TAPE CLOTH 3X10 TAN LF (GAUZE/BANDAGES/DRESSINGS) ×2 IMPLANT
TOWEL GREEN STERILE FF (TOWEL DISPOSABLE) ×4 IMPLANT
TUBE CONNECTING 20X1/4 (TUBING) ×1 IMPLANT
YANKAUER SUCT BULB TIP NO VENT (SUCTIONS) IMPLANT

## 2019-06-12 NOTE — Anesthesia Procedure Notes (Signed)
Procedure Name: Intubation Date/Time: 06/12/2019 1:32 PM Performed by: Willa Frater, CRNA Pre-anesthesia Checklist: Patient identified, Emergency Drugs available, Suction available and Patient being monitored Patient Re-evaluated:Patient Re-evaluated prior to induction Oxygen Delivery Method: Circle system utilized Preoxygenation: Pre-oxygenation with 100% oxygen Induction Type: IV induction Ventilation: Mask ventilation without difficulty Laryngoscope Size: Mac and 4 Grade View: Grade I Tube type: Oral Tube size: 7.0 mm Number of attempts: 1 Airway Equipment and Method: Stylet and Oral airway Placement Confirmation: ETT inserted through vocal cords under direct vision,  positive ETCO2 and breath sounds checked- equal and bilateral Secured at: 23 cm Tube secured with: Tape Dental Injury: Teeth and Oropharynx as per pre-operative assessment

## 2019-06-12 NOTE — Op Note (Signed)
Operative Note  Grant Mcdonald  [id number]  [id number]  06/12/2019   Surgeon: Vikki Ports A ConnorMD  Assistant: none  Procedure performed: Excision of chronic pilonidal abscess and curettage of cyst walls.  Final wound measures approximately 4 cm x 3 cm x 1 cm  Preop diagnosis: chronic pilonidal cyst/abscess Post-op diagnosis/intraop findings: same  Specimens: none Retained items: wet to dry packing which patient will change q24h EBL: minimal cc Complications: none  Description of procedure: After obtaining informed consent the patient was taken to the operating room and placed supine on operating room table wheregeneral endotracheal anesthesia was initiated, preoperative antibiotics were administered, SCDs applied, and a formal timeout was performed.  The patient was then placed in the prone position with all pressure points appropriately padded.  The buttocks were gently spread and secured with tape.  The skin at the superior aspect of the buttocks surrounding the chronic pilonidal abscess was clipped, prepped and draped in usual sterile fashion.  After a field block with quarter percent Marcaine mixed with epinephrine and Exparel, the most prominent sinus was cannulated with a lacrimal duct probe and the entire tract opened with cautery.  The cyst wall contained chronic inflammatory gelatinous granulation tissue but a minimal amount of inspissated hair.  The skin overlying this area was chronically thickened.  The wound was gently probed with a lacrimal duct probe and identified 2 other sinus tracts which were opened.  The cyst had extensions towards the right inferior aspect of the wound as well as along the entire left aspect of the wound.  These were probed and all of the components of the cyst were connected to form 1 open wound.  The cyst wall was friable and would not have held suture material and therefore I excised the majority of the cyst wall from the base of the wound, and the  small areas where the cyst extended laterally underneath the skin were curetted and then ablated with cautery.  Hemostasis was ensured in the wound with cautery.  No further sinus tracts or pits were identified.  More local was infiltrated in the prefascial space.  The wound was then packed with a saline moistened 4 x 4 and a dry dressing applied on top of this which was secured with mesh undergarment.  The patient was then returned to the supine position, awakened, extubated and taken to PACU in stable condition.   All counts were correct at the completion of the case.

## 2019-06-12 NOTE — H&P (Signed)
Surgical H&P  CC: Pilonidal cyst  HPI: This is a very nice 28 year old man who I first saw nearly a year ago, he has got about a 4 year history of a pilonidal cyst which has been draining on and off, and has required several I&D's.  He is a relatively otherwise healthy.  He works in Music therapist.  We had planned to go forward with cyst marsupialization when I saw him back in August 2019, for financial reasons he has deferred surgery until now.  He is back and ready to proceed with scheduling. No changes in his health otherwise   No Known Allergies      Past Medical History:  Diagnosis Date  . Diabetes mellitus without complication ([place])   . Diet-controlled diabetes mellitus ([place])   . Finger fracture, right    right long finger  . Pilonidal cyst          Past Surgical History:  Procedure Laterality Date  . OPEN REDUCTION INTERNAL FIXATION (ORIF) DISTAL PHALANX Right 11/30/2014   Procedure: OPEN REDUCTION INTERNAL FIXATION  RIGHT LONG PHALANX FRACTURE;  Surgeon: Jolyn Nap, MD;  Location: [facility];  Service: Orthopedics;  Laterality: Right;  . ORIF METACARPAL FRACTURE Right 08/27/2012   second metacarpal         Family History  Problem Relation Age of Onset  . Diabetes Father   . Obesity Father   . Heart attack Father   . Diabetes Maternal Grandmother     Social History        Socioeconomic History  . Marital status: Single    Spouse name: Not on file  . Number of children: Not on file  . Years of education: Not on file  . Highest education level: Not on file  Occupational History  . Not on file  Social Needs  . Financial resource strain: Not on file  . Food insecurity    Worry: Not on file    Inability: Not on file  . Transportation needs    Medical: Not on file    Non-medical: Not on file  Tobacco Use  . Smoking status: Current Every Day Smoker    Years: 0.00    Types: E-cigarettes  . Smokeless  tobacco: Former Systems developer    Quit date: 11/18/2013  . Tobacco comment: uses vape  Substance and Sexual Activity  . Alcohol use: Yes    Alcohol/week: 0.0 standard drinks    Comment: 1 glass wine 5 times weekly  . Drug use: No  . Sexual activity: Not on file  Lifestyle  . Physical activity    Days per week: Not on file    Minutes per session: Not on file  . Stress: Not on file  Relationships  . Social Herbalist on phone: Not on file    Gets together: Not on file    Attends religious service: Not on file    Active member of club or organization: Not on file    Attends meetings of clubs or organizations: Not on file    Relationship status: Not on file  Other Topics Concern  . Not on file  Social History Narrative   Diet- No   Caffeine- Yes   Married- Single   House- Yes, 1 story with 2 people   Pets- Frog   Current/Past profession- CNA   Exercise- Yes ( walking 2 times a week)   Living Will- No   DNR- No   POA/HPOA- No  Current Outpatient Medications on File Prior to Visit  Medication Sig Dispense Refill  . metFORMIN (GLUCOPHAGE) 500 MG tablet Take 1 tablet (500 mg total) by mouth 2 (two) times daily with a meal. 60 tablet 3  . metFORMIN (GLUCOPHAGE) 500 MG tablet TAKE 1 TABLET BY MOUTH EVERY DAY WITH BREAKFAST 30 tablet 3  . Multiple Vitamin (MULTIVITAMIN) tablet Take 1 tablet by mouth daily.    . pravastatin (PRAVACHOL) 20 MG tablet Take 1 tablet by mouth with evening meal. 30 tablet 3   No current facility-administered medications on file prior to visit.     Review of Systems: a complete, 10pt review of systems was completed with pertinent positives and negatives as documented in the HPI  Physical Exam: There were no vitals filed for this visit. Gen: alert and well appearing Eye: extraocular motion intact, no scleral icterus ENT: moist mucus membranes, dentition intact Neck: no mass or thyromegaly Chest:  unlabored respirations, symmetrical air entry, clear bilaterally CV: regular rate and rhythm, no pedal edema Abdomen: soft, nontender, nondistended. No mass or organomegaly MSK: strength symmetrical throughout, no deformity Neuro: grossly intact, normal gait Psych: normal mood and affect, appropriate insight Skin: warm and dry, at the superior aspect of the natal cleft there is a chronic cyst about 6 cm in length, chronically draining fibrinopurulent material through an opening on the lower aspect.  2 small midline pits inferiorly.   CBC Latest Ref Rng & Units 01/30/2015 01/28/2015 12/30/2014  WBC 4.0 - 10.5 K/uL 10.6(H) 10.4 7.4  Hemoglobin 13.0 - 17.0 g/dL 16.115.2 09.615.2 04.516.3  Hematocrit 39.0 - 52.0 % 44.7 44.1 47.4  Platelets 150 - 400 K/uL 321 307 281    CMP Latest Ref Rng & Units 01/30/2015 12/30/2014 08/27/2012  Glucose 70 - 99 mg/dL 409(W219(H) 119(J278(H) 478(G109(H)  BUN 6 - 23 mg/dL 7 11 9   Creatinine 0.50 - 1.35 mg/dL 9.560.78 2.130.79 0.860.90  Sodium 135 - 145 mmol/L 136 139 143  Potassium 3.5 - 5.1 mmol/L 3.7 4.4 3.9  Chloride 96 - 112 mmol/L 100 99 107  CO2 19 - 32 mmol/L 27 21 -  Calcium 8.4 - 10.5 mg/dL 9.8 57.810.0 -  Total Protein 6.0 - 8.5 g/dL - 7.2 -  Total Bilirubin 0.0 - 1.2 mg/dL - 0.3 -  Alkaline Phos 39 - 117 IU/L - 98 -  AST 0 - 40 IU/L - 41(H) -  ALT 0 - 44 IU/L - 84(H) -    Recent Labs  No results found for: INR, PROTIME    Imaging: Imaging Results (Last 48 hours)  No results found.     A/P: PILONIDAL SINUS WITH ABSCESS (L05.02) Story: We discussed options including hair removal, ongoing supportive care, cyst excision with primary closure versus healing by secondary intention, marsupialization of the cyst, excision with flap coverage. I recommended marsupialization and advised that this will necessitate an open wound that will take several weeks to heal and required once or twice daily dressing changes at home. He would like to pursue this option. We will schedule at his  convenience   Phylliss Blakeshelsea Granite Godman, MD [facility], [place] Pager [phone]

## 2019-06-12 NOTE — Discharge Instructions (Signed)
Postoperative Instructions Surgery for Pilonidal Disease Restrictions  You may shower after 24 hours but you should avoid soaking in water for more than ten minutes.  There are no dietary restrictions.  You should avoid alcohol while you are on narcotic pain medication.  Activity can be as tolerated.  Wound care - Your incision was intentionally left open with marsupialization. You should pack the incision with gauze, moistened with saline, covered with a dry dressing and secured with tape. This should be done at least daily or whenever the dressing gets wet. The wound will heal over 6-12 weeks.  - Hair removal using clippers, waxing or depilatory creams around the area will help reduce the chance of recurrence. This can be initiated once the wound starts to heal.    Medications  You will be given a prescription for pain medicine when you are discharged from the hospital. In addition to the prescription pain medicine that you were given, you may take Motrin, Advil, or ibuprofen at the same time. This often gives better pain relief than either one by itself.  Stop the prescription and switch to Motrin, Advil, or ibuprofen as soon as these medications can control your pain. (This will reduce your risk of constipation.) -Most patients will experience some swelling and bruising around the incisions.  Ice packs or heating pads (30-60 minutes up to 6 times a day) will help. Use ice for the first few days to help decrease swelling and bruising, then switch to heat to help relax tight/sore spots and speed recovery.  Some people prefer to use ice alone, heat alone, alternating between ice & heat.  Experiment to what works for you.  Swelling and bruising can take several weeks to resolve.  Take Colace 100 mg two times daily until you are seen in the office for your followup visit.  Call the office if:  The pain worsens and you need to increase the amount of pain medication.  You experience  persistent fever or chills. (You may have lowgrade fevers on and off for the days following surgery? this is your bodys normal reaction to surgery.)  There is redness of more than  inch around the incisions.  There is drainage of cloudy fluid or pus (Drainage of a yellowish bloody fluid is normal.)  You experience persistent nausea or vomiting.   Please call the office [phone]) to make a followup appointment for one to two weeks after surgery and if you have any other problems, questions, or concerns.   The clinic staff is available to answer your questions during regular business hours (8:30am-5pm).  Please dont hesitate to call and ask to speak to one of our nurses for clinical concerns.              If you have disability or family leave forms, bring them to the office for processing. Do not give them to your doctor.  If you have a medical emergency, go to the nearest emergency room or call 911.  [facility] always on call at the [facility]   [facility], [place] [address], [address], [place], [place]  [address] ? MAIN: [phone] ? TOLL FREE: [phone] ?  FAX [phone] [email or web address]    No Tylenol or ibuprofen before 6:30pm   Post Anesthesia Home Care Instructions  Activity: Get plenty of rest for the remainder of the day. A responsible individual must stay with you for 24 hours following the procedure.  For the next 24 hours, DO NOT: -Drive a car -Paediatric nurse -Drink alcoholic beverages -Take any medication unless instructed by your physician -Make any legal decisions or sign important papers.  Meals: Start with liquid foods such as gelatin or soup. Progress to regular foods as tolerated. Avoid greasy, spicy, heavy foods. If nausea and/or vomiting occur, drink only clear liquids until the nausea and/or vomiting subsides. Call your physician if vomiting continues.  Special  Instructions/Symptoms: Your throat may feel dry or sore from the anesthesia or the breathing tube placed in your throat during surgery. If this causes discomfort, gargle with warm salt water. The discomfort should disappear within 24 hours.  If you had a scopolamine patch placed behind your ear for the management of post- operative nausea and/or vomiting:  1. The medication in the patch is effective for 72 hours, after which it should be removed.  Wrap patch in a tissue and discard in the trash. Wash hands thoroughly with soap and water. 2. You may remove the patch earlier than 72 hours if you experience unpleasant side effects which may include dry mouth, dizziness or visual disturbances. 3. Avoid touching the patch. Wash your hands with soap and water after contact with the patch.      Information for Discharge Teaching: EXPAREL (bupivacaine liposome injectable suspension)   Your surgeon or anesthesiologist gave you EXPAREL(bupivacaine) to help control your pain after surgery.   EXPAREL is a local anesthetic that provides pain relief by numbing the tissue around the surgical site.  EXPAREL is designed to release pain medication over time and can control pain for up to 72 hours.  Depending on how you respond to EXPAREL, you may require less pain medication during your recovery.  Possible side effects:  Temporary loss of sensation or ability to move in the area where bupivacaine was injected.  Nausea, vomiting, constipation  Rarely, numbness and tingling in your mouth or lips, lightheadedness, or anxiety may occur.  Call your doctor right away if you think you may be experiencing any of these sensations, or if you have other questions regarding possible side effects.  Follow all other discharge instructions given to you by your surgeon or nurse. Eat a healthy diet and drink plenty of water or other fluids.  If you return to the hospital for any reason within 96 hours following the  administration of EXPAREL, it is important for health care providers to know that you have received this anesthetic. A teal colored band has been placed on your arm with the date, time and amount of EXPAREL you have received in order to alert and inform your health care providers. Please leave this armband in place for the full 96 hours following administration, and then you may remove the band.

## 2019-06-12 NOTE — Anesthesia Preprocedure Evaluation (Signed)
Anesthesia Evaluation  Patient identified by MRN, date of birth, ID band Patient awake    Reviewed: Allergy & Precautions, NPO status , Patient's Chart, lab work & pertinent test results  Airway Mallampati: II  TM Distance: >3 FB Neck ROM: Full    Dental no notable dental hx.    Pulmonary Current Smoker,    Pulmonary exam normal breath sounds clear to auscultation       Cardiovascular Normal cardiovascular exam Rhythm:Regular Rate:Normal     Neuro/Psych negative neurological ROS  negative psych ROS   GI/Hepatic negative GI ROS, Neg liver ROS,   Endo/Other  diabetes  Renal/GU negative Renal ROS  negative genitourinary   Musculoskeletal negative musculoskeletal ROS (+)   Abdominal   Peds negative pediatric ROS (+)  Hematology negative hematology ROS (+)   Anesthesia Other Findings   Reproductive/Obstetrics negative OB ROS                             Anesthesia Physical Anesthesia Plan  ASA: II  Anesthesia Plan: MAC   Post-op Pain Management:    Induction: Intravenous  PONV Risk Score and Plan: 1 and Ondansetron and Treatment may vary due to age or medical condition  Airway Management Planned: Simple Face Mask  Additional Equipment:   Intra-op Plan:   Post-operative Plan:   Informed Consent: I have reviewed the patients History and Physical, chart, labs and discussed the procedure including the risks, benefits and alternatives for the proposed anesthesia with the patient or authorized representative who has indicated his/her understanding and acceptance.     Dental advisory given  Plan Discussed with: CRNA and Surgeon  Anesthesia Plan Comments:         Anesthesia Quick Evaluation

## 2019-06-12 NOTE — Transfer of Care (Signed)
Immediate Anesthesia Transfer of Care Note  Patient: Grant Mcdonald  Procedure(s) Performed: MARSUPIALIZATION OF PILONIDAL CYST (N/A Back)  Patient Location: PACU  Anesthesia Type:General  Level of Consciousness: sedated and patient cooperative  Airway & Oxygen Therapy: Patient Spontanous Breathing and Patient connected to face mask oxygen  Post-op Assessment: Report given to RN and Post -op Vital signs reviewed and stable  Post vital signs: Reviewed and stable  Last Vitals:  Vitals Value Taken Time  BP 114/56 06/12/19 1438  Temp    Pulse 65 06/12/19 1442  Resp 15 06/12/19 1442  SpO2 100 % 06/12/19 1442  Vitals shown include unvalidated device data.  Last Pain:  Vitals:   06/12/19 1225  TempSrc: Oral  PainSc: 0-No pain         Complications: No apparent anesthesia complications

## 2019-06-12 NOTE — Anesthesia Postprocedure Evaluation (Signed)
Anesthesia Post Note  Patient: Grant Mcdonald  Procedure(s) Performed: MARSUPIALIZATION OF PILONIDAL CYST (N/A Back)     Patient location during evaluation: PACU Anesthesia Type: General Level of consciousness: awake and alert Pain management: pain level controlled Vital Signs Assessment: post-procedure vital signs reviewed and stable Respiratory status: spontaneous breathing, nonlabored ventilation, respiratory function stable and patient connected to nasal cannula oxygen Cardiovascular status: blood pressure returned to baseline and stable Postop Assessment: no apparent nausea or vomiting Anesthetic complications: no    Last Vitals:  Vitals:   06/12/19 1515 06/12/19 1558  BP: (!) 112/54 114/65  Pulse: 73 69  Resp: 13 14  Temp:  36.6 C  SpO2: 100% 100%    Last Pain:  Vitals:   06/12/19 1558  TempSrc:   PainSc: 0-No pain                 Jaysha Lasure S

## 2019-06-15 ENCOUNTER — Encounter (HOSPITAL_BASED_OUTPATIENT_CLINIC_OR_DEPARTMENT_OTHER): Payer: Self-pay | Admitting: Surgery

## 2021-05-23 ENCOUNTER — Other Ambulatory Visit: Payer: Self-pay

## 2021-05-23 ENCOUNTER — Encounter (HOSPITAL_COMMUNITY): Payer: Self-pay | Admitting: Emergency Medicine

## 2021-05-23 ENCOUNTER — Ambulatory Visit (HOSPITAL_COMMUNITY)
Admission: EM | Admit: 2021-05-23 | Discharge: 2021-05-23 | Disposition: A | Discharge status: Home / Self Care | Payer: 59 | Attending: Urgent Care | Admitting: Urgent Care

## 2021-05-23 DIAGNOSIS — M7918 Myalgia, other site: Secondary | ICD-10-CM

## 2021-05-23 DIAGNOSIS — L0231 Cutaneous abscess of buttock: Secondary | ICD-10-CM | POA: Diagnosis not present

## 2021-05-23 MED ORDER — DOXYCYCLINE HYCLATE 100 MG PO CAPS: 100.0000 mg | ORAL_CAPSULE | Freq: Two times a day (BID) | ORAL | 0 refills | Status: AC

## 2021-05-23 MED ORDER — NAPROXEN 500 MG PO TABS: 500.0000 mg | ORAL_TABLET | Freq: Two times a day (BID) | ORAL | 0 refills | Status: AC

## 2021-05-23 NOTE — ED Provider Notes (Signed)
[name]   MRN: [id number] DOB: 08/30/1991  Subjective:   Grant Mcdonald is a 30 y.o. male presenting for 4-day history of recurrent left buttock pain with swelling.  Patient has had a history of pilonidal cyst and cellulitis of the buttock area.  Denies fever, nausea, vomiting, tailbone pain.  No current facility-administered medications for this encounter.  Current Outpatient Medications:    HYDROcodone-acetaminophen (NORCO/VICODIN) 5-325 MG tablet, Take 1 tablet by mouth every 6 (six) hours as needed for moderate pain. Alternate tylenol and ibuprofen around the clock for the first few days, then take as needed. Take this medication if needed for pain unrelieved by tylenol or ibuprofen. (Patient not taking: Reported on 05/23/2021), Disp: 20 tablet, Rfl: 0   Multiple Vitamin (MULTIVITAMIN) tablet, Take 1 tablet by mouth daily. (Patient not taking: Reported on 05/23/2021), Disp: , Rfl:    No Known Allergies  Past Medical History:  Diagnosis Date   Diabetes mellitus without complication (HCC)    Diet-controlled diabetes mellitus (HCC)    Finger fracture, right    right long finger   Pilonidal cyst    PONV (postoperative nausea and vomiting)      Past Surgical History:  Procedure Laterality Date   OPEN REDUCTION INTERNAL FIXATION (ORIF) DISTAL PHALANX Right 11/30/2014   Procedure: OPEN REDUCTION INTERNAL FIXATION  RIGHT LONG PHALANX FRACTURE;  Surgeon: Jodi Marble, MD;  Location: [facility];  Service: Orthopedics;  Laterality: Right;   ORIF METACARPAL FRACTURE Right 08/27/2012   second metacarpal   PILONIDAL CYST EXCISION N/A 06/12/2019   Procedure: MARSUPIALIZATION OF PILONIDAL CYST;  Surgeon: Berna Bue, MD;  Location: [facility];  Service: General;  Laterality: N/A;  Pilonidal cyst discarded per Dr Fredricka Bonine    Family History  Problem Relation Age of Onset   Diabetes Father    Obesity Father    Heart attack Father     Diabetes Maternal Grandmother     Social History   Tobacco Use   Smoking status: Every Day    Pack years: 0.00    Types: E-cigarettes   Smokeless tobacco: Former    Quit date: 11/18/2013   Tobacco comments:    uses vape  Substance Use Topics   Alcohol use: Yes    Alcohol/week: 0.0 standard drinks    Comment: 1 glass wine 5 times weekly   Drug use: No    ROS   Objective:   Vitals: BP 136/78 (BP Location: Left Arm)   Pulse 77   Temp 98.6 F (37 C) (Oral)   Resp 18   SpO2 97%   Physical Exam Constitutional:      General: He is not in acute distress.    Appearance: Normal appearance. He is well-developed and normal weight. He is not ill-appearing, toxic-appearing or diaphoretic.  HENT:     Head: Normocephalic and atraumatic.     Right Ear: External ear normal.     Left Ear: External ear normal.     Nose: Nose normal.     Mouth/Throat:     Pharynx: Oropharynx is clear.  Eyes:     General: No scleral icterus.       Right eye: No discharge.        Left eye: No discharge.     Extraocular Movements: Extraocular movements intact.     Pupils: Pupils are equal, round, and reactive to light.  Cardiovascular:     Rate and Rhythm: Normal rate.  Pulmonary:     Effort: Pulmonary effort is normal.  Genitourinary:   Musculoskeletal:     Cervical back: Normal range of motion.  Neurological:     Mental Status: He is alert and oriented to person, place, and time.  Psychiatric:        Mood and Affect: Mood normal.        Behavior: Behavior normal.        Thought Content: Thought content normal.        Judgment: Judgment normal.    PROCEDURE NOTE: I&D of Abscess Verbal consent obtained. Local anesthesia with 2cc of 1% lidocaine with epinephrine. Site cleansed with Betadine. Incision of 1/2cm was made using an 11 blade, 2cc expressed consisting of a mixture of pus and serosanguinous fluid. Wound cavity was explored with curved hemostats and loculations loosened. Cleansed  and dressed.   Assessment and Plan :   PDMP not reviewed this encounter.  1. Left buttock abscess   2. Buttock pain     Successful I&D performed.  Wound care reviewed.  Start doxycycline for the abscess, naproxen for pain and inflammation. Counseled patient on potential for adverse effects with medications prescribed/recommended today, ER and return-to-clinic precautions discussed, patient verbalized understanding.    Wallis Bamberg, PA-C 05/23/21 1234

## 2021-05-23 NOTE — Discharge Instructions (Addendum)
Please change your dressing 3-5 times daily. Do not apply any ointments or creams. Each time you change your dressing, make sure that you are pressing on the wound to get pus to come out.  Try your best to have a family member help you clean gently around the perimeter of the wound with gentle soap and warm water. Pat your wound dry and let it air out if possible to make sure it is dry before reapplying another dressing.   

## 2021-05-23 NOTE — ED Triage Notes (Signed)
Recurrent cyst to left buttocks.  Patient noticed this episode 4 days ago

## 2021-05-23 NOTE — ED Notes (Signed)
Instructed to undress

## 2021-06-01 ENCOUNTER — Other Ambulatory Visit: Payer: Self-pay

## 2021-06-01 ENCOUNTER — Encounter (HOSPITAL_COMMUNITY): Payer: Self-pay | Admitting: Emergency Medicine

## 2021-06-01 ENCOUNTER — Ambulatory Visit (HOSPITAL_COMMUNITY)
Admission: EM | Admit: 2021-06-01 | Discharge: 2021-06-01 | Disposition: A | Discharge status: Home / Self Care | Payer: 59 | Attending: Urgent Care | Admitting: Urgent Care

## 2021-06-01 DIAGNOSIS — R739 Hyperglycemia, unspecified: Secondary | ICD-10-CM | POA: Diagnosis not present

## 2021-06-01 DIAGNOSIS — H538 Other visual disturbances: Secondary | ICD-10-CM

## 2021-06-01 DIAGNOSIS — E1165 Type 2 diabetes mellitus with hyperglycemia: Secondary | ICD-10-CM | POA: Diagnosis not present

## 2021-06-01 LAB — CBG MONITORING, ED: Glucose-Capillary: 174 mg/dL — ABNORMAL HIGH (ref 70–99)

## 2021-06-01 NOTE — ED Provider Notes (Signed)
[facility]   MRN: [id number] DOB: 01/23/1991  Subjective:   Grant Mcdonald is a 30 y.o. male presenting for acute onset of intermittent blurred vision this morning.  Denies fever, eye pain, eye drainage, eye redness, tunnel vision, eye trauma.  Reports that symptoms started after he tried to move a piece of furniture that was really dusty.  Notes that 5 minutes afterwards he had his current symptoms.  He also has a history of uncontrolled type 2 diabetes.  Reports that this was largely related to a lot of alcohol use and very poor diet.  Admits that he is cut back significantly on his alcohol but also eats very erratically.  No current facility-administered medications for this encounter.  Current Outpatient Medications:    doxycycline (VIBRAMYCIN) 100 MG capsule, Take 1 capsule (100 mg total) by mouth 2 (two) times daily., Disp: 20 capsule, Rfl: 0   HYDROcodone-acetaminophen (NORCO/VICODIN) 5-325 MG tablet, Take 1 tablet by mouth every 6 (six) hours as needed for moderate pain. Alternate tylenol and ibuprofen around the clock for the first few days, then take as needed. Take this medication if needed for pain unrelieved by tylenol or ibuprofen. (Patient not taking: No sig reported), Disp: 20 tablet, Rfl: 0   Multiple Vitamin (MULTIVITAMIN) tablet, Take 1 tablet by mouth daily. (Patient not taking: No sig reported), Disp: , Rfl:    naproxen (NAPROSYN) 500 MG tablet, Take 1 tablet (500 mg total) by mouth 2 (two) times daily with a meal., Disp: 30 tablet, Rfl: 0   No Known Allergies  Past Medical History:  Diagnosis Date   Diabetes mellitus without complication (HCC)    Diet-controlled diabetes mellitus (HCC)    Finger fracture, right    right long finger   Pilonidal cyst    PONV (postoperative nausea and vomiting)      Past Surgical History:  Procedure Laterality Date   OPEN REDUCTION INTERNAL FIXATION (ORIF) DISTAL PHALANX Right 11/30/2014   Procedure: OPEN  REDUCTION INTERNAL FIXATION  RIGHT LONG PHALANX FRACTURE;  Surgeon: Jodi Marble, MD;  Location: [facility];  Service: Orthopedics;  Laterality: Right;   ORIF METACARPAL FRACTURE Right 08/27/2012   second metacarpal   PILONIDAL CYST EXCISION N/A 06/12/2019   Procedure: MARSUPIALIZATION OF PILONIDAL CYST;  Surgeon: Berna Bue, MD;  Location: [facility];  Service: General;  Laterality: N/A;  Pilonidal cyst discarded per Dr Fredricka Bonine    Family History  Problem Relation Age of Onset   Diabetes Father    Obesity Father    Heart attack Father    Diabetes Maternal Grandmother     Social History   Tobacco Use   Smoking status: Every Day    Types: E-cigarettes   Smokeless tobacco: Former    Quit date: 11/18/2013   Tobacco comments:    uses vape  Substance Use Topics   Alcohol use: Yes    Alcohol/week: 0.0 standard drinks    Comment: 1 glass wine 5 times weekly   Drug use: No    ROS   Objective:   Vitals: BP 132/86 (BP Location: Left Arm)   Pulse 85   Temp (!) 97.4 F (36.3 C) (Oral)   Resp 15   SpO2 96%   Physical Exam Constitutional:      General: He is not in acute distress.    Appearance: Normal appearance. He is well-developed and normal weight. He is not ill-appearing, toxic-appearing or diaphoretic.  HENT:  Head: Normocephalic and atraumatic.     Right Ear: External ear normal.     Left Ear: External ear normal.     Nose: Nose normal.     Mouth/Throat:     Pharynx: Oropharynx is clear.  Eyes:     General: Lids are normal. Lids are everted, no foreign bodies appreciated. Vision grossly intact. Gaze aligned appropriately. No allergic shiner, visual field deficit or scleral icterus.       Right eye: No foreign body, discharge or hordeolum.        Left eye: No foreign body, discharge or hordeolum.     Extraocular Movements: Extraocular movements intact.     Right eye: Normal extraocular motion.     Left eye: Normal  extraocular motion.     Conjunctiva/sclera: Conjunctivae normal.     Right eye: Right conjunctiva is not injected. No chemosis, exudate or hemorrhage.    Left eye: Left conjunctiva is not injected. No chemosis, exudate or hemorrhage.    Pupils: Pupils are equal, round, and reactive to light.  Cardiovascular:     Rate and Rhythm: Normal rate.  Pulmonary:     Effort: Pulmonary effort is normal.  Musculoskeletal:     Cervical back: Normal range of motion.  Neurological:     Mental Status: He is alert and oriented to person, place, and time.  Psychiatric:        Mood and Affect: Mood normal.        Behavior: Behavior normal.        Thought Content: Thought content normal.        Judgment: Judgment normal.    Results for orders placed or performed during the hospital encounter of 06/01/21 (from the past 24 hour(s))  POC CBG monitoring     Status: Abnormal   Collection Time: 06/01/21  3:24 PM  Result Value Ref Range   Glucose-Capillary 174 (H) 70 - 99 mg/dL   Eye Exam: Eyelids everted and swept for foreign body. The eye was anesthetized with 2 drops of tetracaine and stained with fluorescein. Examination under woods lamp does not reveal a foreign body or area of increased stain uptake. The eye was then irrigated copiously with saline.   Assessment and Plan :   PDMP not reviewed this encounter.  1. Hyperglycemia   2. Blurred vision, bilateral     Patient has normal eye exam.  I discussed with patient that his blurry vision may be due to uncontrolled diabetes and hyperglycemia.  Recommended blood work which the patient refused.  He states he would like to check back with a regular doctor on this.  As I am not able to perform a complete dilated eye exam, recommended he contact the ophthalmologist on-call, Dr. Vonna Kotyk. Counseled patient on potential for adverse effects with medications prescribed/recommended today, ER and return-to-clinic precautions discussed, patient verbalized  understanding.    Wallis Bamberg, PA-C 06/01/21 9702

## 2021-06-01 NOTE — ED Triage Notes (Signed)
Patient c/o double vision that started this morning.   Patient endorses vision changes started this morning. Patient endorses " I feel like I have cockeye".   Patient denies eye pain or headache.   Patient denies history of eye problems.   Patient denies use of corrective vision.

## 2021-06-05 ENCOUNTER — Other Ambulatory Visit: Payer: Self-pay

## 2021-06-05 ENCOUNTER — Emergency Department (HOSPITAL_COMMUNITY)
Admission: EM | Admit: 2021-06-05 | Discharge: 2021-06-06 | Disposition: A | Discharge status: Home / Self Care | Payer: 59 | Attending: Student | Admitting: Student

## 2021-06-05 ENCOUNTER — Emergency Department (HOSPITAL_COMMUNITY): Payer: 59

## 2021-06-05 DIAGNOSIS — Z5321 Procedure and treatment not carried out due to patient leaving prior to being seen by health care provider: Secondary | ICD-10-CM | POA: Insufficient documentation

## 2021-06-05 DIAGNOSIS — I639 Cerebral infarction, unspecified: Secondary | ICD-10-CM | POA: Diagnosis not present

## 2021-06-05 DIAGNOSIS — H579 Unspecified disorder of eye and adnexa: Secondary | ICD-10-CM | POA: Insufficient documentation

## 2021-06-05 LAB — CBC
HCT: 46.8 % (ref 39.0–52.0)
Hemoglobin: 15.8 g/dL (ref 13.0–17.0)
MCH: 31.1 pg (ref 26.0–34.0)
MCHC: 33.8 g/dL (ref 30.0–36.0)
MCV: 92.1 fL (ref 80.0–100.0)
Platelets: 288 10*3/uL (ref 150–400)
RBC: 5.08 MIL/uL (ref 4.22–5.81)
RDW: 13.5 % (ref 11.5–15.5)
WBC: 6.4 10*3/uL (ref 4.0–10.5)
nRBC: 0 % (ref 0.0–0.2)

## 2021-06-05 LAB — DIFFERENTIAL
Abs Immature Granulocytes: 0.03 10*3/uL (ref 0.00–0.07)
Basophils Absolute: 0.1 10*3/uL (ref 0.0–0.1)
Basophils Relative: 1 %
Eosinophils Absolute: 0.3 10*3/uL (ref 0.0–0.5)
Eosinophils Relative: 5 %
Immature Granulocytes: 1 %
Lymphocytes Relative: 39 %
Lymphs Abs: 2.5 10*3/uL (ref 0.7–4.0)
Monocytes Absolute: 0.8 10*3/uL (ref 0.1–1.0)
Monocytes Relative: 12 %
Neutro Abs: 2.8 10*3/uL (ref 1.7–7.7)
Neutrophils Relative %: 42 %

## 2021-06-05 LAB — COMPREHENSIVE METABOLIC PANEL
ALT: 99 U/L — ABNORMAL HIGH (ref 0–44)
AST: 56 U/L — ABNORMAL HIGH (ref 15–41)
Albumin: 4.7 g/dL (ref 3.5–5.0)
Alkaline Phosphatase: 70 U/L (ref 38–126)
Anion gap: 8 (ref 5–15)
BUN: 10 mg/dL (ref 6–20)
CO2: 25 mmol/L (ref 22–32)
Calcium: 10.1 mg/dL (ref 8.9–10.3)
Chloride: 104 mmol/L (ref 98–111)
Creatinine, Ser: 0.73 mg/dL (ref 0.61–1.24)
GFR, Estimated: >60 mL/min (ref >60)
Glucose, Bld: 114 mg/dL — ABNORMAL HIGH (ref 70–99)
Potassium: 4.4 mmol/L (ref 3.5–5.1)
Sodium: 137 mmol/L (ref 135–145)
Total Bilirubin: 0.4 mg/dL (ref 0.3–1.2)
Total Protein: 7.9 g/dL (ref 6.5–8.1)

## 2021-06-05 LAB — I-STAT CHEM 8, ED
BUN: 11 mg/dL (ref 6–20)
Calcium, Ion: 1.27 mmol/L (ref 1.15–1.40)
Chloride: 105 mmol/L (ref 98–111)
Creatinine, Ser: 0.7 mg/dL (ref 0.61–1.24)
Glucose, Bld: 109 mg/dL — ABNORMAL HIGH (ref 70–99)
HCT: 48 % (ref 39.0–52.0)
Hemoglobin: 16.3 g/dL (ref 13.0–17.0)
Potassium: 4.4 mmol/L (ref 3.5–5.1)
Sodium: 138 mmol/L (ref 135–145)
TCO2: 26 mmol/L (ref 22–32)

## 2021-06-05 LAB — RAPID URINE DRUG SCREEN, HOSP PERFORMED
Amphetamines: NOT DETECTED
Barbiturates: NOT DETECTED
Benzodiazepines: NOT DETECTED
Cocaine: POSITIVE — AB
Opiates: NOT DETECTED
Tetrahydrocannabinol: NOT DETECTED

## 2021-06-05 LAB — PROTIME-INR
INR: 1 (ref 0.8–1.2)
Prothrombin Time: 13.6 seconds (ref 11.4–15.2)

## 2021-06-05 LAB — URINALYSIS, ROUTINE W REFLEX MICROSCOPIC
Bilirubin Urine: NEGATIVE
Glucose, UA: NEGATIVE mg/dL
Hgb urine dipstick: NEGATIVE
Ketones, ur: NEGATIVE mg/dL
Leukocytes,Ua: NEGATIVE
Nitrite: NEGATIVE
Protein, ur: NEGATIVE mg/dL
Specific Gravity, Urine: 1.016 (ref 1.005–1.030)
pH: 6 (ref 5.0–8.0)

## 2021-06-05 LAB — APTT: aPTT: 28 seconds (ref 24–36)

## 2021-06-05 LAB — ETHANOL: Alcohol, Ethyl (B): <10 mg/dL (ref <10)

## 2021-06-05 NOTE — ED Triage Notes (Signed)
C/O left eye movement issue on Thursday, now resolved, stated refer here for imaging. Denies any vision loss.

## 2021-06-05 NOTE — ED Provider Notes (Signed)
Emergency Medicine Provider Triage Evaluation Note  [facility]cd[facility] , a 30 y.o. male  was evaluated in triage.  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 Sent from opthalmology, Dr. Dione Booze, for CVA/ demyelinating working per Dr. Wilford Corner. Patient had deviated left eye a few days ago that completely resolved. He also endorses blurry vision. Seen at [facility] on 7/14 where blurry vision was though to be related to hyperglycemia. No speech changes or unilateral weakness.  Review of Systems  Positive: Visual changes Negative: weakness  Physical Exam  BP 131/74 (BP Location: Right Arm)   Pulse 75   Temp 98.6 F (37 C) (Oral)   Resp 18   Ht 6\' 4"  (1.93 m)   Wt 86.2 kg   SpO2 100%   BMI 23.13 kg/m  Gen:   Awake, no distress   Resp:  Normal effort  MSK:   Moves extremities without difficulty  Other:    Medical Decision Making  Medically screening exam initiated at 1:08 PM.  Appropriate orders placed.  was informed that the remainder of the evaluation will be completed by another provider, this initial triage assessment does not replace that evaluation, and the importance of remaining in the ED until their evaluation is complete.  Stroke labs and CT head ordered per note from ophthalmology   Toribio Harbour, PA-C 06/05/21 1310    06/07/21, MD 06/15/21 [id number]

## 2021-06-06 DIAGNOSIS — H5120 Internuclear ophthalmoplegia, unspecified eye: Secondary | ICD-10-CM

## 2021-06-06 NOTE — Progress Notes (Signed)
Grant Mcdonald was sent to the ED by optho for evaluation of potential episode of INO that self resolved. I attempted to find Grant Mcdonald in the ER waiting room but I could not, neither could the ED front desk staff. I attempted to reach out to him on the listed cell phone and home phone number but could not get in touch with anyone. No one answered the phone.  Erick Blinks [facility] Pager Number [phone]
